# Patient Record
Sex: Female | Born: 1980 | Race: Black or African American | Hispanic: No | Marital: Married | Smoking: Never smoker
Health system: Southern US, Community
[De-identification: ages and names within clinical notes are randomized; demographics above are authoritative.]

## PROBLEM LIST (undated history)

## (undated) ENCOUNTER — Inpatient Hospital Stay (HOSPITAL_COMMUNITY): Payer: Self-pay

## (undated) DIAGNOSIS — E039 Hypothyroidism, unspecified: Secondary | ICD-10-CM

## (undated) DIAGNOSIS — E0590 Thyrotoxicosis, unspecified without thyrotoxic crisis or storm: Secondary | ICD-10-CM

## (undated) DIAGNOSIS — IMO0002 Reserved for concepts with insufficient information to code with codable children: Secondary | ICD-10-CM

---
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
## 2002-02-16 ENCOUNTER — Other Ambulatory Visit: Admission: RE | Admit: 2002-02-16 | Discharge: 2002-02-16 | Payer: Self-pay | Admitting: Obstetrics and Gynecology

## 2003-01-16 ENCOUNTER — Ambulatory Visit (HOSPITAL_COMMUNITY): Admission: RE | Admit: 2003-01-16 | Discharge: 2003-01-16 | Payer: Self-pay | Admitting: Internal Medicine

## 2003-03-03 HISTORY — PX: LEEP: SHX91

## 2004-05-05 ENCOUNTER — Other Ambulatory Visit: Admission: RE | Admit: 2004-05-05 | Discharge: 2004-05-05 | Payer: Self-pay | Admitting: Internal Medicine

## 2004-12-31 ENCOUNTER — Other Ambulatory Visit: Admission: RE | Admit: 2004-12-31 | Discharge: 2004-12-31 | Payer: Self-pay | Admitting: Obstetrics and Gynecology

## 2011-01-20 ENCOUNTER — Ambulatory Visit (HOSPITAL_COMMUNITY)
Admission: AD | Admit: 2011-01-20 | Discharge: 2011-01-20 | Disposition: A | Discharge status: Home / Self Care | Payer: BC Managed Care – PPO | Source: Ambulatory Visit | Attending: Internal Medicine | Admitting: Internal Medicine

## 2011-01-20 ENCOUNTER — Inpatient Hospital Stay (HOSPITAL_COMMUNITY): Payer: BC Managed Care – PPO

## 2011-01-20 DIAGNOSIS — R1032 Left lower quadrant pain: Secondary | ICD-10-CM | POA: Insufficient documentation

## 2011-01-20 DIAGNOSIS — K5900 Constipation, unspecified: Secondary | ICD-10-CM | POA: Insufficient documentation

## 2011-01-20 DIAGNOSIS — O99891 Other specified diseases and conditions complicating pregnancy: Secondary | ICD-10-CM | POA: Insufficient documentation

## 2011-06-16 NOTE — H&P (Signed)
NAMEBREALYN, BARIL NO.:  [id number]  MEDICAL RECORD NO.:  [id number]  LOCATION:  [facility]                         FACILITY:  [facility]  PHYSICIAN:  Juluis Mire, M.D.   DATE OF BIRTH:  1980/08/24  DATE OF ADMISSION:  06/18/2011 DATE OF DISCHARGE:                             HISTORY & PHYSICAL   Date of her surgery is 07/01/2011 at [facility].  HISTORY OF PRESENT ILLNESS:  The patient is a 31 year old gravida 1, para 0 female who presents for D and E for nonviable first trimester pregnancy.  The patient was seen for pregnancy confirmation on April 8th ultrasound, at that time revealed gestational sac consistent with approximately 7 weeks.  No fetal pole or yolk sac was seen.  She was having no bleeding. We followed her up a week later and the sac had enlarged, but still no fetal pole or yolk sac consistent with an anembryonic pregnancy.  She now presents for dilatation and evacuation.  ALLERGIES:  In terms of allergies, she is allergic to [facility].  MEDICATIONS:  None.  PAST MEDICAL HISTORY:  Usual childhood diseases.  No significant sequelae.  SURGICAL HISTORY:  None.  FAMILY HISTORY:  Noncontributory.  SOCIAL HISTORY:  Reveals no tobacco or alcohol use.  REVIEW OF SYSTEMS:  Noncontributory.  PHYSICAL EXAMINATION:  VITAL SIGNS:  The patient is afebrile with stable vital signs. HEENT:  The patient is normocephalic.  Pupils are equal, round, reactive to light and accommodation.  Extraocular movements are intact.  Sclerae and conjunctivae are clear.  Oropharynx clear. BREASTS:  Not examined. LUNGS:  Clear. CARDIOVASCULAR SYSTEM:  Regular rhythm and rate without murmurs or gallops. ABDOMEN:  Abdominal exam is benign.  No mass, organomegaly, or tenderness. PELVIC:  Normal external genitalia.  Vaginal mucosa is clear.  Cervix unremarkable.  Uterus approximately 8-9 weeks in size.  Adnexa unremarkable. EXTREMITIES:  Trace edema. NEUROLOGIC:   Grossly within normal limits.  IMPRESSION:  Nonviable first trimester pregnancy.  PLAN:  The patient undergo dilatation and evacuation.  The risk of procedure have been discussed including the risk of infection.  The risk of hemorrhage that could require transfusion with the risk of AIDS or hepatitis.  Excessive bleeding could require hysterectomy.  There is a risk of perforation leading to injury to adjacent organs requiring further exploratory surgery.  Risk of deep venous thrombosis and pulmonary embolus.  The patient expressed understand of indications and risks.  Blood type will be determined.     Juluis Mire, M.D.     JSM/MEDQ  D:  06/16/2011  T:  06/16/2011  Job:  [id number]

## 2011-06-16 NOTE — H&P (Signed)
  Patient name  Kelsey Dodson, Kelsey Dodson DICTATION#  [id number] CSN# [id number]  Juluis Mire, MD 06/16/2011 1:22 PM

## 2011-06-17 ENCOUNTER — Encounter (HOSPITAL_COMMUNITY): Payer: Self-pay | Admitting: Pharmacist

## 2011-06-17 MED ORDER — CEFAZOLIN SODIUM 1-5 GM-% IV SOLN
1.0000 g | INTRAVENOUS | Status: AC
  Administered 2011-06-18: 1 g via INTRAVENOUS

## 2011-06-18 ENCOUNTER — Encounter (HOSPITAL_COMMUNITY)
Admission: RE | Disposition: A | Discharge status: Home / Self Care | Payer: Self-pay | Source: Ambulatory Visit | Attending: Obstetrics and Gynecology

## 2011-06-18 ENCOUNTER — Ambulatory Visit (HOSPITAL_COMMUNITY)
Admission: RE | Admit: 2011-06-18 | Discharge: 2011-06-18 | Disposition: A | Discharge status: Home / Self Care | Payer: BC Managed Care – PPO | Source: Ambulatory Visit | Attending: Obstetrics and Gynecology | Admitting: Obstetrics and Gynecology

## 2011-06-18 ENCOUNTER — Ambulatory Visit (HOSPITAL_COMMUNITY): Payer: BC Managed Care – PPO | Admitting: Anesthesiology

## 2011-06-18 ENCOUNTER — Encounter (HOSPITAL_COMMUNITY): Payer: Self-pay | Admitting: *Deleted

## 2011-06-18 ENCOUNTER — Encounter (HOSPITAL_COMMUNITY): Payer: Self-pay | Admitting: Anesthesiology

## 2011-06-18 DIAGNOSIS — O021 Missed abortion: Secondary | ICD-10-CM | POA: Insufficient documentation

## 2011-06-18 DIAGNOSIS — Z332 Encounter for elective termination of pregnancy: Secondary | ICD-10-CM

## 2011-06-18 HISTORY — PX: DILATION AND EVACUATION: SHX1459

## 2011-06-18 LAB — CBC
HCT: 39.7 % (ref 36.0–46.0)
Hemoglobin: 13.4 g/dL (ref 12.0–15.0)
MCH: 30.5 pg (ref 26.0–34.0)
MCHC: 33.8 g/dL (ref 30.0–36.0)
MCV: 90.4 fL (ref 78.0–100.0)
Platelets: 332 10*3/uL (ref 150–400)
RBC: 4.39 MIL/uL (ref 3.87–5.11)
RDW: 12.4 % (ref 11.5–15.5)
WBC: 11.2 10*3/uL — ABNORMAL HIGH (ref 4.0–10.5)

## 2011-06-18 LAB — ABO/RH: ABO/RH(D): A POS

## 2011-06-18 SURGERY — DILATION AND EVACUATION, UTERUS
Anesthesia: Monitor Anesthesia Care | Wound class: Clean Contaminated

## 2011-06-18 MED ORDER — LACTATED RINGERS IV SOLN
INTRAVENOUS | Status: DC
  Administered 2011-06-18: 12:00:00 via INTRAVENOUS

## 2011-06-18 MED ORDER — KETOROLAC TROMETHAMINE 30 MG/ML IJ SOLN
INTRAMUSCULAR | Status: AC
  Filled 2011-06-18: qty 1

## 2011-06-18 MED ORDER — FENTANYL CITRATE 0.05 MG/ML IJ SOLN
INTRAMUSCULAR | Status: DC | PRN
  Administered 2011-06-18: 100 ug via INTRAVENOUS

## 2011-06-18 MED ORDER — PROPOFOL 10 MG/ML IV EMUL
INTRAVENOUS | Status: DC | PRN
  Administered 2011-06-18 (×3): 20 mg via INTRAVENOUS
  Administered 2011-06-18: 10 mg via INTRAVENOUS

## 2011-06-18 MED ORDER — FENTANYL CITRATE 0.05 MG/ML IJ SOLN: 25.0000 ug | INTRAMUSCULAR | Status: DC | PRN

## 2011-06-18 MED ORDER — CEFAZOLIN SODIUM 1-5 GM-% IV SOLN
INTRAVENOUS | Status: AC
  Filled 2011-06-18: qty 50

## 2011-06-18 MED ORDER — CHLOROPROCAINE HCL 1 % IJ SOLN
INTRAMUSCULAR | Status: AC
  Filled 2011-06-18: qty 30

## 2011-06-18 MED ORDER — ONDANSETRON HCL 4 MG/2ML IJ SOLN
INTRAMUSCULAR | Status: DC | PRN
  Administered 2011-06-18: 4 mg via INTRAVENOUS

## 2011-06-18 MED ORDER — MIDAZOLAM HCL 5 MG/5ML IJ SOLN
INTRAMUSCULAR | Status: DC | PRN
  Administered 2011-06-18: 2 mg via INTRAVENOUS

## 2011-06-18 MED ORDER — MIDAZOLAM HCL 2 MG/2ML IJ SOLN
INTRAMUSCULAR | Status: AC
  Filled 2011-06-18: qty 2

## 2011-06-18 MED ORDER — CHLOROPROCAINE HCL 1 % IJ SOLN
INTRAMUSCULAR | Status: DC | PRN
  Administered 2011-06-18: 20 mL

## 2011-06-18 MED ORDER — PROPOFOL 10 MG/ML IV EMUL
INTRAVENOUS | Status: AC
  Filled 2011-06-18: qty 20

## 2011-06-18 MED ORDER — ONDANSETRON HCL 4 MG/2ML IJ SOLN
INTRAMUSCULAR | Status: AC
  Filled 2011-06-18: qty 2

## 2011-06-18 MED ORDER — KETOROLAC TROMETHAMINE 30 MG/ML IJ SOLN: 15.0000 mg | Freq: Once | INTRAMUSCULAR | Status: DC | PRN

## 2011-06-18 MED ORDER — DEXAMETHASONE SODIUM PHOSPHATE 10 MG/ML IJ SOLN
INTRAMUSCULAR | Status: DC | PRN
  Administered 2011-06-18: 10 mg via INTRAVENOUS

## 2011-06-18 MED ORDER — DEXAMETHASONE SODIUM PHOSPHATE 10 MG/ML IJ SOLN
INTRAMUSCULAR | Status: AC
  Filled 2011-06-18: qty 1

## 2011-06-18 MED ORDER — MEPERIDINE HCL 25 MG/ML IJ SOLN: 6.2500 mg | INTRAMUSCULAR | Status: DC | PRN

## 2011-06-18 MED ORDER — ONDANSETRON HCL 4 MG/2ML IJ SOLN: 4.0000 mg | Freq: Once | INTRAMUSCULAR | Status: DC | PRN

## 2011-06-18 MED ORDER — FENTANYL CITRATE 0.05 MG/ML IJ SOLN
INTRAMUSCULAR | Status: AC
  Filled 2011-06-18: qty 2

## 2011-06-18 MED ORDER — OXYCODONE-ACETAMINOPHEN 5-500 MG PO CAPS: 1.0000 | ORAL_CAPSULE | ORAL | Status: AC | PRN

## 2011-06-18 MED ORDER — LIDOCAINE HCL (CARDIAC) 20 MG/ML IV SOLN
INTRAVENOUS | Status: DC | PRN
  Administered 2011-06-18: 60 mg via INTRAVENOUS

## 2011-06-18 MED ORDER — LIDOCAINE HCL (CARDIAC) 20 MG/ML IV SOLN
INTRAVENOUS | Status: AC
  Filled 2011-06-18: qty 5

## 2011-06-18 SURGICAL SUPPLY — 19 items
CATH ROBINSON RED A/P 16FR (CATHETERS) ×2 IMPLANT
CLOTH BEACON ORANGE TIMEOUT ST (SAFETY) ×2 IMPLANT
DECANTER SPIKE VIAL GLASS SM (MISCELLANEOUS) ×2 IMPLANT
GLOVE BIO SURGEON STRL SZ7 (GLOVE) ×4 IMPLANT
GLOVE NEODERM STER SZ 7 (GLOVE) ×2 IMPLANT
GOWN PREVENTION PLUS LG XLONG (DISPOSABLE) ×3 IMPLANT
KIT BERKELEY 1ST TRIMESTER 3/8 (MISCELLANEOUS) ×2 IMPLANT
NDL SPNL 20GX3.5 QUINCKE YW (NEEDLE) ×1 IMPLANT
NEEDLE SPNL 20GX3.5 QUINCKE YW (NEEDLE) ×2 IMPLANT
NS IRRIG 1000ML POUR BTL (IV SOLUTION) ×2 IMPLANT
PACK VAGINAL MINOR WOMEN LF (CUSTOM PROCEDURE TRAY) ×2 IMPLANT
PAD PREP 24X48 CUFFED NSTRL (MISCELLANEOUS) ×2 IMPLANT
SET BERKELEY SUCTION TUBING (SUCTIONS) ×2 IMPLANT
SYR CONTROL 10ML LL (SYRINGE) ×2 IMPLANT
TOWEL OR 17X24 6PK STRL BLUE (TOWEL DISPOSABLE) ×4 IMPLANT
VACURETTE 10 RIGID CVD (CANNULA) IMPLANT
VACURETTE 7MM CVD STRL WRAP (CANNULA) IMPLANT
VACURETTE 8 RIGID CVD (CANNULA) ×1 IMPLANT
VACURETTE 9 RIGID CVD (CANNULA) IMPLANT

## 2011-06-18 NOTE — H&P (Signed)
  histoey and physicalo exam unchanged

## 2011-06-18 NOTE — Op Note (Signed)
Patient name  Kelsey Dodson, Kelsey Dodson DICTATION#  [id number] CSN# [id number]  Juluis Mire, MD 06/18/2011 1:22 PM

## 2011-06-18 NOTE — Discharge Instructions (Signed)

## 2011-06-18 NOTE — Brief Op Note (Signed)
06/18/2011  1:21 PM  PATIENT:  Kelsey Dodson  31 y.o. female  PRE-OPERATIVE DIAGNOSIS:  NONVIABLE FIRST TRIMESTER PREGNANCY  POST-OPERATIVE DIAGNOSIS:  NONVIABLE FIRST TRIMESTER PREGNANCY  PROCEDURE:  Procedure(s) (LRB): DILATATION AND EVACUATION (N/A)  SURGEON:  Surgeon(s) and Role:    * Juluis Mire, MD - Primary  PHYSICIAN ASSISTANT:   ASSISTANTS: none   ANESTHESIA:   local and IV sedation  EBL:  Total I/O In: -  Out: 50 [Blood:50]  BLOOD ADMINISTERED:none  DRAINS: none   LOCAL MEDICATIONS USED:  OTHER nesicaine 20 cc  SPECIMEN:  Source of Specimen:  poc  DISPOSITION OF SPECIMEN:  PATHOLOGY  COUNTS:  YES  TOURNIQUET:  * No tourniquets in log *  DICTATION: .Other Dictation: Dictation Number [id number]  PLAN OF CARE: Discharge to home after PACU  PATIENT DISPOSITION:  PACU - hemodynamically stable.   Delay start of Pharmacological VTE agent (>24hrs) due to surgical blood loss or risk of bleeding: no

## 2011-06-18 NOTE — Transfer of Care (Signed)
Immediate Anesthesia Transfer of Care Note  Patient: Kelsey Dodson  Procedure(s) Performed: Procedure(s) (LRB): DILATATION AND EVACUATION (N/A)  Patient Location: PACU  Anesthesia Type: MAC  Level of Consciousness: sedated  Airway & Oxygen Therapy: Patient Spontanous Breathing  Post-op Assessment: Report given to PACU RN  Post vital signs: Reviewed and stable  Complications: No apparent anesthesia complications

## 2011-06-18 NOTE — Anesthesia Postprocedure Evaluation (Signed)
Anesthesia Post Note  Patient: Kelsey Dodson  Procedure(s) Performed: Procedure(s) (LRB): DILATATION AND EVACUATION (N/A)  Anesthesia type: MAC  Patient location: PACU  Post pain: Pain level controlled  Post assessment: Post-op Vital signs reviewed  Last Vitals:  Filed Vitals:   06/18/11 1345  BP: 124/64  Pulse: 58  Temp: 37.1 C  Resp: 16    Post vital signs: Reviewed  Level of consciousness: sedated  Complications: No apparent anesthesia complications

## 2011-06-18 NOTE — Anesthesia Preprocedure Evaluation (Signed)
Anesthesia Evaluation  Patient identified by MRN, date of birth, ID band  Reviewed: Allergy & Precautions, H&P , NPO status , Patient's Chart, lab work & pertinent test results  Airway Mallampati: I TM Distance: >3 FB Neck ROM: full    Dental  (+) Teeth Intact   Pulmonary neg pulmonary ROS,    Pulmonary exam normal       Cardiovascular negative cardio ROS      Neuro/Psych negative neurological ROS  negative psych ROS   GI/Hepatic negative GI ROS, Neg liver ROS,   Endo/Other  negative endocrine ROS  Renal/GU negative Renal ROS  negative genitourinary   Musculoskeletal negative musculoskeletal ROS (+)   Abdominal Normal abdominal exam  (+)   Peds negative pediatric ROS (+)  Hematology negative hematology ROS (+)   Anesthesia Other Findings   Reproductive/Obstetrics (+) Pregnancy                           Anesthesia Physical Anesthesia Plan  ASA: II  Anesthesia Plan: MAC   Post-op Pain Management:    Induction: Intravenous  Airway Management Planned:   Additional Equipment:   Intra-op Plan:   Post-operative Plan:   Informed Consent: I have reviewed the patients History and Physical, chart, labs and discussed the procedure including the risks, benefits and alternatives for the proposed anesthesia with the patient or authorized representative who has indicated his/her understanding and acceptance.     Plan Discussed with: CRNA and Surgeon  Anesthesia Plan Comments:         Anesthesia Quick Evaluation

## 2011-06-19 NOTE — Op Note (Signed)
[name], Kelsey Dodson               ACCOUNT NO.:  [id number]  MEDICAL RECORD NO.:  [id number]  LOCATION:  [facility]                          FACILITY:  [facility]  PHYSICIAN:  Juluis Mire, M.D.   DATE OF BIRTH:  [birth year]  DATE OF PROCEDURE:  06/18/2011 DATE OF DISCHARGE:  06/18/2011                              OPERATIVE REPORT   PREOPERATIVE DIAGNOSIS:  Nonviable first trimester pregnancy.  POSTOPERATIVE DIAGNOSIS:  Nonviable first trimester pregnancy.  PROCEDURE:  Paracervical block.  Dilation and evacuation.  SURGEON:  Juluis Mire, MD  ANESTHESIA:  Sedation with paracervical block.  ESTIMATED BLOOD LOSS:  200-250 mL.  PACKS:  None.  DRAINS:  None.  INTRAOPERATIVE BLOOD PLACED:  None.  COMPLICATIONS:  None.  INDICATION:  Dictated in history and physical.  Procedure as follows; the patient was taken to the OR, and placed in supine position.  After satisfactory level of sedation, she was placed in the dorsal lithotomy position using the Allen stirrups.  The patient draped as a sterile field.  Spec was placed in vaginal vault.  Cervix was cleansed with Betadine.  Paracervical block was instituted using 2% Nesacaine.  Cervix was grasped with single-tooth tenaculum.  Uterus was retroflexed and sounded to approximately 11 cm.  Cervix was serially dilated to size 27 Pratt dilator.  Size 8 curved suction curette was introduced to intrauterine cavity, was evacuated using the suction curette.  We continued until no additional tissue was obtained.  We then did sharp curetting.  All quadrants were cleared by the gritty field and repeat suction curetting revealed no additional tissue.  The uterus was contracting down well and bleeding was minimal.  At this point in time, the single-tooth tenaculum speculum then removed.  The patient taken out of dorsal lithotomy position.  Once alert, transferred to recovery room in good condition.  Sponge, instrument, and needle count was correct by  circulating nurse.     Juluis Mire, M.D.     JSM/MEDQ  D:  06/18/2011  T:  06/25/11  Job:  [id number]

## 2011-06-22 ENCOUNTER — Encounter (HOSPITAL_COMMUNITY): Payer: Self-pay | Admitting: Obstetrics and Gynecology

## 2011-07-01 DEATH — deceased

## 2012-04-19 LAB — OB RESULTS CONSOLE RPR: RPR: NONREACTIVE

## 2012-04-19 LAB — OB RESULTS CONSOLE HEPATITIS B SURFACE ANTIGEN: Hepatitis B Surface Ag: NEGATIVE

## 2012-04-19 LAB — OB RESULTS CONSOLE RUBELLA ANTIBODY, IGM: Rubella: IMMUNE

## 2012-04-19 LAB — OB RESULTS CONSOLE ABO/RH: RH Type: POSITIVE

## 2012-04-19 LAB — OB RESULTS CONSOLE GC/CHLAMYDIA
Chlamydia: NEGATIVE
Gonorrhea: NEGATIVE

## 2012-04-19 LAB — OB RESULTS CONSOLE HIV ANTIBODY (ROUTINE TESTING): HIV: NONREACTIVE

## 2012-04-19 LAB — OB RESULTS CONSOLE ANTIBODY SCREEN: Antibody Screen: NEGATIVE

## 2012-07-13 ENCOUNTER — Encounter (HOSPITAL_COMMUNITY): Payer: Self-pay | Admitting: *Deleted

## 2012-07-13 ENCOUNTER — Inpatient Hospital Stay (HOSPITAL_COMMUNITY)
Admission: AD | Admit: 2012-07-13 | Discharge: 2012-07-13 | Disposition: A | Discharge status: Home / Self Care | Payer: BC Managed Care – PPO | Source: Ambulatory Visit | Attending: Obstetrics and Gynecology | Admitting: Obstetrics and Gynecology

## 2012-07-13 ENCOUNTER — Inpatient Hospital Stay (HOSPITAL_COMMUNITY): Payer: BC Managed Care – PPO

## 2012-07-13 DIAGNOSIS — O26899 Other specified pregnancy related conditions, unspecified trimester: Secondary | ICD-10-CM

## 2012-07-13 DIAGNOSIS — O99891 Other specified diseases and conditions complicating pregnancy: Secondary | ICD-10-CM | POA: Insufficient documentation

## 2012-07-13 DIAGNOSIS — R109 Unspecified abdominal pain: Secondary | ICD-10-CM

## 2012-07-13 HISTORY — DX: Reserved for concepts with insufficient information to code with codable children: IMO0002

## 2012-07-13 LAB — URINALYSIS, ROUTINE W REFLEX MICROSCOPIC
Protein, ur: NEGATIVE mg/dL
Urobilinogen, UA: 0.2 mg/dL (ref 0.0–1.0)

## 2012-07-13 LAB — URINE MICROSCOPIC-ADD ON

## 2012-07-13 LAB — WET PREP, GENITAL
Clue Cells Wet Prep HPF POC: NONE SEEN
Trich, Wet Prep: NONE SEEN

## 2012-07-13 NOTE — MAU Note (Signed)
Pt reports pain in her "buttocks" yesterday, was seen in office for b/p recheck (b/p was nml) and cervical length per u/s was 2.8. Pain this am is in pelvic area and is coming about every 3 minutes.

## 2012-07-13 NOTE — MAU Provider Note (Signed)
History     CSN: [id number]  Arrival date and time: 07/13/12 4098   None     Chief Complaint  Patient presents with  . Abdominal Pain   HPI  Pt is a G2P0010 here at 20.2 wks  IUP with report of intermittent abdominal pain (lower pelvis) that started around 0300 this morning.  Pain is described as sharp.  Cervical length 2.8 from last ultrasound.  Follow-up appt scheduled for this Friday (repeat ultrasound).  No report of vaginal bleeding or leaking of fluid.    Past Medical History  Diagnosis Date  . Abnormal Pap smear     Past Surgical History  Procedure Laterality Date  . Leep  2005  . Dilation and evacuation  06/18/2011    Procedure: DILATATION AND EVACUATION;  Surgeon: Juluis Mire, MD;  Location: [facility];  Service: Gynecology;  Laterality: N/A;    History reviewed. No pertinent family history.  History  Substance Use Topics  . Smoking status: Never Smoker   . Smokeless tobacco: Never Used  . Alcohol Use: No    Allergies:  Allergies  Allergen Reactions  . Levofloxacin Hives  . Macrobid (Nitrofurantoin) Hives    Prescriptions prior to admission  Medication Sig Dispense Refill  . acetaminophen (TYLENOL) 325 MG tablet Take 650 mg by mouth once.      . cetirizine (ZYRTEC) 10 MG tablet Take 10 mg by mouth daily as needed. For allergies      . Prenatal Vit-Fe Fumarate-FA (PRENATAL MULTIVITAMIN) TABS Take 1 tablet by mouth daily.      . sodium chloride (OCEAN) 0.65 % nasal spray Place 1 spray into the nose as needed. For allergies        Review of Systems  Gastrointestinal: Positive for abdominal pain (intermittent pain).  All other systems reviewed and are negative.   Physical Exam   Blood pressure 122/65, pulse 86, temperature 98.3 F (36.8 C), temperature source Oral, resp. rate 20, height 5\' 7"  (1.702 m), weight 83.462 kg (184 lb), SpO2 100.00%.  Physical Exam  Constitutional: She is oriented to person, place, and time. She appears well-developed and  well-nourished. No distress.  HENT:  Head: Normocephalic.  Neck: Normal range of motion. Neck supple.  Cardiovascular: Normal rate, regular rhythm and normal heart sounds.   Respiratory: Effort normal and breath sounds normal.  GI: Soft. There is tenderness (lower pelvis).  Genitourinary: No bleeding around the vagina. Vaginal discharge (large amount of white, creamy discharge) found.  Neurological: She is alert and oriented to person, place, and time.  Skin: Skin is warm and dry.   Cervix - closed, palpates short, uterus feels lower in pelvis  MAU Course  Procedures  Consult with Dr. Arelia Sneddon > cervical length  Results for orders placed during the hospital encounter of 07/13/12 (from the past 24 hour(s))  URINALYSIS, ROUTINE W REFLEX MICROSCOPIC     Status: Abnormal   Collection Time    07/13/12  7:02 AM      Result Value Range   Color, Urine YELLOW  YELLOW   APPearance CLEAR  CLEAR   Specific Gravity, Urine 1.015  1.005 - 1.030   pH 7.0  5.0 - 8.0   Glucose, UA NEGATIVE  NEGATIVE mg/dL   Hgb urine dipstick SMALL (*) NEGATIVE   Bilirubin Urine NEGATIVE  NEGATIVE   Ketones, ur NEGATIVE  NEGATIVE mg/dL   Protein, ur NEGATIVE  NEGATIVE mg/dL   Urobilinogen, UA 0.2  0.0 - 1.0 mg/dL   Nitrite  NEGATIVE  NEGATIVE   Leukocytes, UA NEGATIVE  NEGATIVE  URINE MICROSCOPIC-ADD ON     Status: None   Collection Time    07/13/12  7:02 AM      Result Value Range   Squamous Epithelial / LPF RARE  RARE   WBC, UA 0-2  <3 WBC/hpf   Bacteria, UA RARE  RARE  WET PREP, GENITAL     Status: Abnormal   Collection Time    07/13/12  8:25 AM      Result Value Range   Yeast Wet Prep HPF POC NONE SEEN  NONE SEEN   Trich, Wet Prep NONE SEEN  NONE SEEN   Clue Cells Wet Prep HPF POC NONE SEEN  NONE SEEN   WBC, Wet Prep HPF POC FEW (*) NONE SEEN   Ultrasound:   Cervix closed; 2.7-3.0  Assessment and Plan  Abdominal Pain in Pregnancy - Normal Exam  Plan: DC to home Provide Reassurance Keep  scheduled appt on Friday  [facility] 07/13/2012, 8:10 AM

## 2012-11-03 LAB — OB RESULTS CONSOLE GBS: GBS: NEGATIVE

## 2012-11-23 ENCOUNTER — Inpatient Hospital Stay (HOSPITAL_COMMUNITY): Payer: BC Managed Care – PPO | Admitting: Anesthesiology

## 2012-11-23 ENCOUNTER — Encounter (HOSPITAL_COMMUNITY): Payer: Self-pay | Admitting: *Deleted

## 2012-11-23 ENCOUNTER — Inpatient Hospital Stay (HOSPITAL_COMMUNITY)
Admission: AD | Admit: 2012-11-23 | Discharge: 2012-11-25 | DRG: 373 | Disposition: A | Discharge status: Home / Self Care | Payer: BC Managed Care – PPO | Source: Ambulatory Visit | Attending: Obstetrics and Gynecology | Admitting: Obstetrics and Gynecology

## 2012-11-23 ENCOUNTER — Encounter (HOSPITAL_COMMUNITY): Payer: Self-pay | Admitting: Anesthesiology

## 2012-11-23 LAB — CBC
HCT: 34.1 % — ABNORMAL LOW (ref 36.0–46.0)
Hemoglobin: 11.7 g/dL — ABNORMAL LOW (ref 12.0–15.0)
MCH: 29.5 pg (ref 26.0–34.0)
MCV: 85.9 fL (ref 78.0–100.0)
RBC: 3.97 MIL/uL (ref 3.87–5.11)
WBC: 12.6 10*3/uL — ABNORMAL HIGH (ref 4.0–10.5)

## 2012-11-23 MED ORDER — OXYCODONE-ACETAMINOPHEN 5-325 MG PO TABS: 1.0000 | ORAL_TABLET | ORAL | Status: DC | PRN

## 2012-11-23 MED ORDER — INFLUENZA VAC SPLIT QUAD 0.5 ML IM SUSP
0.5000 mL | INTRAMUSCULAR | Status: AC
  Administered 2012-11-24: 0.5 mL via INTRAMUSCULAR

## 2012-11-23 MED ORDER — LIDOCAINE HCL (PF) 1 % IJ SOLN
INTRAMUSCULAR | Status: DC | PRN
  Administered 2012-11-23 (×2): 4 mL

## 2012-11-23 MED ORDER — BUTORPHANOL TARTRATE 1 MG/ML IJ SOLN
1.0000 mg | Freq: Once | INTRAMUSCULAR | Status: AC
  Administered 2012-11-23: 1 mg via INTRAVENOUS
  Filled 2012-11-23: qty 1

## 2012-11-23 MED ORDER — LACTATED RINGERS IV SOLN
INTRAVENOUS | Status: DC
  Administered 2012-11-23 (×3): via INTRAVENOUS

## 2012-11-23 MED ORDER — ONDANSETRON HCL 4 MG PO TABS: 4.0000 mg | ORAL_TABLET | ORAL | Status: DC | PRN

## 2012-11-23 MED ORDER — LANOLIN HYDROUS EX OINT: TOPICAL_OINTMENT | CUTANEOUS | Status: DC | PRN

## 2012-11-23 MED ORDER — WITCH HAZEL-GLYCERIN EX PADS: 1.0000 "application " | MEDICATED_PAD | CUTANEOUS | Status: DC | PRN

## 2012-11-23 MED ORDER — FAMOTIDINE 20 MG PO TABS
20.0000 mg | ORAL_TABLET | Freq: Two times a day (BID) | ORAL | Status: DC
  Administered 2012-11-23 – 2012-11-25 (×2): 20 mg via ORAL
  Filled 2012-11-23 (×2): qty 1

## 2012-11-23 MED ORDER — IBUPROFEN 600 MG PO TABS: 600.0000 mg | ORAL_TABLET | Freq: Four times a day (QID) | ORAL | Status: DC | PRN

## 2012-11-23 MED ORDER — MEASLES, MUMPS & RUBELLA VAC ~~LOC~~ INJ
0.5000 mL | INJECTION | Freq: Once | SUBCUTANEOUS | Status: DC
  Filled 2012-11-23: qty 0.5

## 2012-11-23 MED ORDER — LIDOCAINE HCL (PF) 1 % IJ SOLN
30.0000 mL | INTRAMUSCULAR | Status: DC | PRN
  Filled 2012-11-23: qty 30

## 2012-11-23 MED ORDER — DIPHENHYDRAMINE HCL 50 MG/ML IJ SOLN: 12.5000 mg | INTRAMUSCULAR | Status: DC | PRN

## 2012-11-23 MED ORDER — PRENATAL MULTIVITAMIN CH
1.0000 | ORAL_TABLET | Freq: Every day | ORAL | Status: DC
  Administered 2012-11-24: 1 via ORAL
  Filled 2012-11-23: qty 1

## 2012-11-23 MED ORDER — DIPHENHYDRAMINE HCL 25 MG PO CAPS: 25.0000 mg | ORAL_CAPSULE | Freq: Four times a day (QID) | ORAL | Status: DC | PRN

## 2012-11-23 MED ORDER — LACTATED RINGERS IV SOLN: 500.0000 mL | INTRAVENOUS | Status: DC | PRN

## 2012-11-23 MED ORDER — EPHEDRINE 5 MG/ML INJ
10.0000 mg | INTRAVENOUS | Status: DC | PRN
  Administered 2012-11-23: 10 mg via INTRAVENOUS
  Filled 2012-11-23: qty 2

## 2012-11-23 MED ORDER — CITRIC ACID-SODIUM CITRATE 334-500 MG/5ML PO SOLN: 30.0000 mL | ORAL | Status: DC | PRN

## 2012-11-23 MED ORDER — DIBUCAINE 1 % RE OINT: 1.0000 "application " | TOPICAL_OINTMENT | RECTAL | Status: DC | PRN

## 2012-11-23 MED ORDER — SENNOSIDES-DOCUSATE SODIUM 8.6-50 MG PO TABS
2.0000 | ORAL_TABLET | ORAL | Status: DC
  Administered 2012-11-24 – 2012-11-25 (×2): 2 via ORAL

## 2012-11-23 MED ORDER — IBUPROFEN 600 MG PO TABS
600.0000 mg | ORAL_TABLET | Freq: Four times a day (QID) | ORAL | Status: DC
  Administered 2012-11-24 – 2012-11-25 (×6): 600 mg via ORAL
  Filled 2012-11-23 (×6): qty 1

## 2012-11-23 MED ORDER — PHENYLEPHRINE 40 MCG/ML (10ML) SYRINGE FOR IV PUSH (FOR BLOOD PRESSURE SUPPORT)
80.0000 ug | PREFILLED_SYRINGE | INTRAVENOUS | Status: DC | PRN
  Filled 2012-11-23: qty 2

## 2012-11-23 MED ORDER — FENTANYL 2.5 MCG/ML BUPIVACAINE 1/10 % EPIDURAL INFUSION (WH - ANES)
INTRAMUSCULAR | Status: DC | PRN
  Administered 2012-11-23: 14 mL/h via EPIDURAL

## 2012-11-23 MED ORDER — ACETAMINOPHEN 325 MG PO TABS: 650.0000 mg | ORAL_TABLET | ORAL | Status: DC | PRN

## 2012-11-23 MED ORDER — ONDANSETRON HCL 4 MG/2ML IJ SOLN: 4.0000 mg | Freq: Four times a day (QID) | INTRAMUSCULAR | Status: DC | PRN

## 2012-11-23 MED ORDER — BENZOCAINE-MENTHOL 20-0.5 % EX AERO
1.0000 "application " | INHALATION_SPRAY | CUTANEOUS | Status: DC | PRN
  Administered 2012-11-23: 1 via TOPICAL
  Filled 2012-11-23: qty 56

## 2012-11-23 MED ORDER — OXYTOCIN BOLUS FROM INFUSION: 500.0000 mL | INTRAVENOUS | Status: DC

## 2012-11-23 MED ORDER — SIMETHICONE 80 MG PO CHEW: 80.0000 mg | CHEWABLE_TABLET | ORAL | Status: DC | PRN

## 2012-11-23 MED ORDER — FENTANYL 2.5 MCG/ML BUPIVACAINE 1/10 % EPIDURAL INFUSION (WH - ANES)
14.0000 mL/h | INTRAMUSCULAR | Status: DC | PRN
  Filled 2012-11-23: qty 125

## 2012-11-23 MED ORDER — PHENYLEPHRINE 40 MCG/ML (10ML) SYRINGE FOR IV PUSH (FOR BLOOD PRESSURE SUPPORT)
80.0000 ug | PREFILLED_SYRINGE | INTRAVENOUS | Status: DC | PRN
  Filled 2012-11-23: qty 2
  Filled 2012-11-23: qty 5

## 2012-11-23 MED ORDER — MEDROXYPROGESTERONE ACETATE 150 MG/ML IM SUSP: 150.0000 mg | INTRAMUSCULAR | Status: DC | PRN

## 2012-11-23 MED ORDER — EPHEDRINE 5 MG/ML INJ
10.0000 mg | INTRAVENOUS | Status: DC | PRN
  Filled 2012-11-23: qty 2
  Filled 2012-11-23: qty 4

## 2012-11-23 MED ORDER — LACTATED RINGERS IV SOLN
500.0000 mL | Freq: Once | INTRAVENOUS | Status: AC
  Administered 2012-11-23: 1000 mL via INTRAVENOUS

## 2012-11-23 MED ORDER — TETANUS-DIPHTH-ACELL PERTUSSIS 5-2.5-18.5 LF-MCG/0.5 IM SUSP: 0.5000 mL | Freq: Once | INTRAMUSCULAR | Status: DC

## 2012-11-23 MED ORDER — ONDANSETRON HCL 4 MG/2ML IJ SOLN: 4.0000 mg | INTRAMUSCULAR | Status: DC | PRN

## 2012-11-23 MED ORDER — OXYTOCIN 40 UNITS IN LACTATED RINGERS INFUSION - SIMPLE MED
62.5000 mL/h | INTRAVENOUS | Status: DC
  Filled 2012-11-23: qty 1000

## 2012-11-23 MED ORDER — PROMETHAZINE HCL 25 MG/ML IJ SOLN
12.5000 mg | Freq: Once | INTRAMUSCULAR | Status: AC
  Administered 2012-11-23: 12.5 mg via INTRAVENOUS
  Filled 2012-11-23: qty 1

## 2012-11-23 NOTE — Anesthesia Procedure Notes (Signed)
Epidural Patient location during procedure: OB Start time: 11/23/2012 2:56 PM  Staffing Anesthesiologist: Daved Mcfann A. Performed by: anesthesiologist   Preanesthetic Checklist Completed: patient identified, site marked, surgical consent, pre-op evaluation, timeout performed, IV checked, risks and benefits discussed and monitors and equipment checked  Epidural Patient position: sitting Prep: site prepped and draped and DuraPrep Patient monitoring: continuous pulse ox and blood pressure Approach: midline Injection technique: LOR air  Needle:  Needle type: Tuohy  Needle gauge: 17 G Needle length: 9 cm and 9 Needle insertion depth: 5 cm cm Catheter type: closed end flexible Catheter size: 19 Gauge Catheter at skin depth: 10 cm Test dose: negative and Other  Assessment Events: blood not aspirated, injection not painful, no injection resistance, negative IV test and no paresthesia  Additional Notes Patient identified. Risks and benefits discussed including failed block, incomplete  Pain control, post dural puncture headache, nerve damage, paralysis, blood pressure Changes, nausea, vomiting, reactions to medications-both toxic and allergic and post Partum back pain. All questions were answered. Patient expressed understanding and wished to proceed. Sterile technique was used throughout procedure. Epidural site was Dressed with sterile barrier dressing. No paresthesias, signs of intravascular injection Or signs of intrathecal spread were encountered.  Patient was more comfortable after the epidural was dosed. Please see RN's note for documentation of vital signs and FHR which are stable.

## 2012-11-23 NOTE — Progress Notes (Signed)
SVD of viable female infant w/ apgars of 4,8. Nuchal cord x 1 easily reduced.   When Infant slow to respond to suction and stimulation, Code apgar was called and team arrived quickly. NICU team present within 2 min of delivery to continue resuscitation Arterial pH:  7.12, Venous pH:  7.15 Placenta delivered spontaneous w/ 3VC.   1st degree & right labial lac repaired w/ 3-0 vicryl rapide.  Fundus firm.  EBL 400cc .

## 2012-11-23 NOTE — MAU Note (Signed)
Was contracting last night. Started again at 0300, now q 3-6. No bleeding or leaking. First baby, was 2 cm on Tues. No problems with preg.

## 2012-11-23 NOTE — H&P (Signed)
Kelsey Dodson is a 32 y.o. female presenting for labor.  Pregnancy uncomplicated.  No vb or lof.  History OB History   Grav Para Term Preterm Abortions TAB SAB Ect Mult Living   2    1  1    0     Past Medical History  Diagnosis Date  . Abnormal Pap smear    Past Surgical History  Procedure Laterality Date  . Leep  2005  . Dilation and evacuation  06/18/2011    Procedure: DILATATION AND EVACUATION;  Surgeon: Juluis Mire, MD;  Location: [facility];  Service: Gynecology;  Laterality: N/A;   Family History: family history is not on file. Social History:  reports that she has never smoked. She has never used smokeless tobacco. She reports that she does not drink alcohol or use illicit drugs.   Prenatal Transfer Tool  Maternal Diabetes: No Genetic Screening: Declined Maternal Ultrasounds/Referrals: Normal Fetal Ultrasounds or other Referrals:  None Maternal Substance Abuse:  No Significant Maternal Medications:  None Significant Maternal Lab Results:  None Other Comments:  None  ROS  Dilation: 5 Effacement (%): 90 Station: -1 Exam by:: Nysir Fergusson Blood pressure 134/73, pulse 70, temperature 98.4 F (36.9 C), temperature source Oral, resp. rate 18, height 5\' 7"  (1.702 m), weight 94.348 kg (208 lb). Exam Physical Exam  Prenatal labs: ABO, Rh:   Antibody:   Rubella:   RPR:    HBsAg:    HIV:    GBS: Negative (09/04 0000)   Assessment/Plan: Admit Exp mngt Epidural prn   Yohance Hathorne 11/23/2012, 2:32 PM

## 2012-11-23 NOTE — Anesthesia Preprocedure Evaluation (Signed)

## 2012-11-24 LAB — CBC
HCT: 29.9 % — ABNORMAL LOW (ref 36.0–46.0)
MCH: 29.4 pg (ref 26.0–34.0)
MCHC: 34.1 g/dL (ref 30.0–36.0)
Platelets: 221 10*3/uL (ref 150–400)
RDW: 14.3 % (ref 11.5–15.5)

## 2012-11-24 LAB — TYPE AND SCREEN
ABO/RH(D): A POS
Antibody Screen: NEGATIVE

## 2012-11-24 NOTE — Progress Notes (Signed)
Post Partum Day 1 Subjective: no complaints, up ad lib, voiding and tolerating PO  Objective: Blood pressure 130/81, pulse 88, temperature 98.7 F (37.1 C), temperature source Oral, resp. rate 18, height 5\' 7"  (1.702 m), weight 208 lb (94.348 kg), SpO2 100.00%, unknown if currently breastfeeding.  Physical Exam:  General: alert and cooperative Lochia: appropriate Uterine Fundus: firm Incision: perineum intact DVT Evaluation: No evidence of DVT seen on physical exam. Negative Homan's sign. No cords or calf tenderness. No significant calf/ankle edema.   Recent Labs  11/23/12 1205 11/24/12 0550  HGB 11.7* 10.2*  HCT 34.1* 29.9*    Assessment/Plan: Plan for discharge tomorrow   LOS: 1 day   Ab Leaming G 11/24/2012, 8:01 AM

## 2012-11-24 NOTE — Anesthesia Postprocedure Evaluation (Signed)
  Anesthesia Post-op Note  Anesthesia Post Note  Patient: Kelsey Dodson  Procedure(s) Performed: * No procedures listed *  Anesthesia type: Epidural  Patient location: Mother/Baby  Post pain: Pain level controlled  Post assessment: Post-op Vital signs reviewed  Last Vitals:  Filed Vitals:   11/24/12 0400  BP: 130/81  Pulse: 88  Temp: 37.1 C  Resp: 18    Post vital signs: Reviewed  Level of consciousness:alert  Complications: No apparent anesthesia complications

## 2012-11-25 MED ORDER — IBUPROFEN 600 MG PO TABS: 600.0000 mg | ORAL_TABLET | Freq: Four times a day (QID) | ORAL | Status: DC

## 2012-11-25 NOTE — Discharge Summary (Signed)
Obstetric Discharge Summary Reason for Admission: onset of labor Prenatal Procedures: ultrasound Intrapartum Procedures: spontaneous vaginal delivery Postpartum Procedures: none Complications-Operative and Postpartum: 1 degree perineal laceration Hemoglobin  Date Value Range Status  11/24/2012 10.2* 12.0 - 15.0 Dodson/dL Final     HCT  Date Value Range Status  11/24/2012 29.9* 36.0 - 46.0 % Final    Physical Exam:  General: alert and cooperative Lochia: appropriate Uterine Fundus: firm Incision: perineum intact DVT Evaluation: No evidence of DVT seen on physical exam. Negative Homan's sign. No cords or calf tenderness. No significant calf/ankle edema.  Discharge Diagnoses: Term Pregnancy-delivered  Discharge Information: Date: 11/25/2012 Activity: pelvic rest Diet: routine Medications: PNV and Ibuprofen Condition: stable Instructions: refer to practice specific booklet Discharge to: home   Newborn Data: Live born female  Birth Weight: 6 lb 15.6 oz (3164 Dodson) APGAR: 4, 8  Home with mother.  Kelsey Dodson 11/25/2012, 8:03 AM

## 2012-11-28 ENCOUNTER — Inpatient Hospital Stay (HOSPITAL_COMMUNITY): Admission: RE | Admit: 2012-11-28 | Payer: BC Managed Care – PPO | Source: Ambulatory Visit

## 2014-01-01 ENCOUNTER — Encounter (HOSPITAL_COMMUNITY): Payer: Self-pay | Admitting: *Deleted

## 2014-01-04 ENCOUNTER — Other Ambulatory Visit: Payer: Self-pay | Admitting: Obstetrics and Gynecology

## 2014-01-05 LAB — CYTOLOGY - PAP

## 2017-03-29 ENCOUNTER — Other Ambulatory Visit (HOSPITAL_COMMUNITY): Payer: Self-pay | Admitting: Internal Medicine

## 2017-03-29 DIAGNOSIS — E0590 Thyrotoxicosis, unspecified without thyrotoxic crisis or storm: Secondary | ICD-10-CM

## 2017-04-07 ENCOUNTER — Encounter (HOSPITAL_COMMUNITY)
Admission: RE | Admit: 2017-04-07 | Discharge: 2017-04-07 | Disposition: A | Discharge status: Home / Self Care | Payer: BLUE CROSS/BLUE SHIELD | Source: Ambulatory Visit | Attending: Internal Medicine | Admitting: Internal Medicine

## 2017-04-07 DIAGNOSIS — E0590 Thyrotoxicosis, unspecified without thyrotoxic crisis or storm: Secondary | ICD-10-CM | POA: Insufficient documentation

## 2017-04-07 MED ORDER — SODIUM IODIDE I 131 CAPSULE
17.0000 | Freq: Once | INTRAVENOUS | Status: AC | PRN
  Administered 2017-04-07: 17 via ORAL

## 2017-04-08 ENCOUNTER — Encounter (HOSPITAL_COMMUNITY)
Admission: RE | Admit: 2017-04-08 | Discharge: 2017-04-08 | Disposition: A | Discharge status: Home / Self Care | Payer: BLUE CROSS/BLUE SHIELD | Source: Ambulatory Visit | Attending: Internal Medicine | Admitting: Internal Medicine

## 2017-04-08 MED ORDER — SODIUM PERTECHNETATE TC 99M INJECTION
11.0000 | Freq: Once | INTRAVENOUS | Status: AC | PRN
  Administered 2017-04-08: 11 via INTRAVENOUS

## 2018-03-15 DIAGNOSIS — E0590 Thyrotoxicosis, unspecified without thyrotoxic crisis or storm: Secondary | ICD-10-CM | POA: Diagnosis not present

## 2018-03-23 DIAGNOSIS — E0500 Thyrotoxicosis with diffuse goiter without thyrotoxic crisis or storm: Secondary | ICD-10-CM | POA: Diagnosis not present

## 2018-03-23 DIAGNOSIS — Z6829 Body mass index (BMI) 29.0-29.9, adult: Secondary | ICD-10-CM | POA: Diagnosis not present

## 2018-03-23 DIAGNOSIS — E0590 Thyrotoxicosis, unspecified without thyrotoxic crisis or storm: Secondary | ICD-10-CM | POA: Diagnosis not present

## 2018-03-30 DIAGNOSIS — R3121 Asymptomatic microscopic hematuria: Secondary | ICD-10-CM | POA: Diagnosis not present

## 2018-04-04 DIAGNOSIS — R3121 Asymptomatic microscopic hematuria: Secondary | ICD-10-CM | POA: Diagnosis not present

## 2018-04-04 DIAGNOSIS — N200 Calculus of kidney: Secondary | ICD-10-CM | POA: Diagnosis not present

## 2018-04-26 ENCOUNTER — Encounter (HOSPITAL_COMMUNITY): Payer: Self-pay | Admitting: *Deleted

## 2018-04-26 ENCOUNTER — Inpatient Hospital Stay (HOSPITAL_COMMUNITY): Payer: BLUE CROSS/BLUE SHIELD

## 2018-04-26 ENCOUNTER — Inpatient Hospital Stay (HOSPITAL_COMMUNITY)
Admission: AD | Admit: 2018-04-26 | Discharge: 2018-04-26 | Disposition: A | Discharge status: Home Health | Payer: BLUE CROSS/BLUE SHIELD | Attending: Obstetrics and Gynecology | Admitting: Obstetrics and Gynecology

## 2018-04-26 ENCOUNTER — Other Ambulatory Visit: Payer: Self-pay

## 2018-04-26 DIAGNOSIS — O4690 Antepartum hemorrhage, unspecified, unspecified trimester: Secondary | ICD-10-CM

## 2018-04-26 DIAGNOSIS — Z3A01 Less than 8 weeks gestation of pregnancy: Secondary | ICD-10-CM | POA: Insufficient documentation

## 2018-04-26 DIAGNOSIS — O200 Threatened abortion: Secondary | ICD-10-CM | POA: Diagnosis not present

## 2018-04-26 DIAGNOSIS — O209 Hemorrhage in early pregnancy, unspecified: Secondary | ICD-10-CM | POA: Diagnosis not present

## 2018-04-26 DIAGNOSIS — Z6790 Unspecified blood type, Rh positive: Secondary | ICD-10-CM

## 2018-04-26 HISTORY — DX: Thyrotoxicosis, unspecified without thyrotoxic crisis or storm: E05.90

## 2018-04-26 LAB — WET PREP, GENITAL
Clue Cells Wet Prep HPF POC: NONE SEEN
Sperm: NONE SEEN
Trich, Wet Prep: NONE SEEN
Yeast Wet Prep HPF POC: NONE SEEN

## 2018-04-26 LAB — URINALYSIS, ROUTINE W REFLEX MICROSCOPIC
Bacteria, UA: NONE SEEN
Bilirubin Urine: NEGATIVE
GLUCOSE, UA: NEGATIVE mg/dL
Ketones, ur: NEGATIVE mg/dL
Leukocytes,Ua: NEGATIVE
NITRITE: NEGATIVE
Protein, ur: NEGATIVE mg/dL
Specific Gravity, Urine: 1.018 (ref 1.005–1.030)
pH: 6 (ref 5.0–8.0)

## 2018-04-26 LAB — CBC
HCT: 41.1 % (ref 36.0–46.0)
HEMOGLOBIN: 13.1 g/dL (ref 12.0–15.0)
MCH: 29.5 pg (ref 26.0–34.0)
MCHC: 31.9 g/dL (ref 30.0–36.0)
MCV: 92.6 fL (ref 80.0–100.0)
PLATELETS: 296 10*3/uL (ref 150–400)
RBC: 4.44 MIL/uL (ref 3.87–5.11)
RDW: 11.9 % (ref 11.5–15.5)
WBC: 10.1 10*3/uL (ref 4.0–10.5)
nRBC: 0 % (ref 0.0–0.2)

## 2018-04-26 LAB — POCT PREGNANCY, URINE: PREG TEST UR: POSITIVE — AB

## 2018-04-26 LAB — HCG, QUANTITATIVE, PREGNANCY: hCG, Beta Chain, Quant, S: 819 m[IU]/mL — ABNORMAL HIGH (ref <5)

## 2018-04-26 NOTE — MAU Note (Signed)
Is 7 wks preg.  Last night before bed, had some low back pain.  At 0340, got up and used the restroom, had some white mucous with a tinge of pink. Has progressed to a darker red.  Every time she uses the restroom, when she wipes- she sees red, has had 2 red spots.  Dr's office told her to come.no pain now.

## 2018-04-26 NOTE — Discharge Instructions (Signed)
Threatened Miscarriage  A threatened miscarriage occurs when a woman has vaginal bleeding during the first 20 weeks of pregnancy but the pregnancy has not ended. If you have vaginal bleeding during this time, your health care provider will do tests to make sure you are still pregnant. If the tests show that you are still pregnant and that the developing baby (fetus) inside your uterus is still growing, your condition is considered a threatened miscarriage. A threatened miscarriage does not mean your pregnancy will end, but it does increase the risk of losing your pregnancy (complete miscarriage). What are the causes? The cause of this condition is usually not known. For women who go on to have a complete miscarriage, the most common cause is an abnormal number of chromosomes in the developing baby. Chromosomes are the structures inside cells that hold all of a person's genetic material. What increases the risk? The following lifestyle factors may increase your risk of a miscarriage in early pregnancy:  Smoking.  Drinking excessive amounts of alcohol or caffeine.  Recreational drug use. The following preexisting health conditions may increase your risk of a miscarriage in early pregnancy:  Polycystic ovary syndrome.  Uterine fibroids.  Infections.  Diabetes mellitus. What are the signs or symptoms? Symptoms of this condition include:  Vaginal bleeding.  Mild abdominal pain or cramps. How is this diagnosed? If you have bleeding with or without abdominal pain before 20 weeks of pregnancy, your health care provider will do tests to check whether you are still pregnant. These will include:  Ultrasound. This test uses sound waves to create images of the inside of your uterus. This allows your health care provider to look at your developing baby and other structures, such as your placenta.  Pelvic exam. This is an internal exam of your vagina and cervix.  Measurement of your baby's heart  rate.  Laboratory tests such as blood tests, urine tests, or swabs for infection You may be diagnosed with a threatened miscarriage if:  Ultrasound testing shows that you are still pregnant.  Your baby's heart rate is strong.  A pelvic exam shows that the opening between your uterus and your vagina (cervix) is closed.  Blood tests confirm that you are still pregnant. How is this treated? No treatments have been shown to prevent a threatened miscarriage from going on to a complete miscarriage. However, the right home care is important. Follow these instructions at home:  Get plenty of rest.  Do not have sex or use tampons if you have vaginal bleeding.  Do not douche.  Do not smoke or use recreational drugs.  Do not drink alcohol.  Avoid caffeine.  Keep all follow-up prenatal visits as told by your health care provider. This is important. Contact a health care provider if:  You have light vaginal bleeding or spotting while pregnant.  You have abdominal pain or cramping.  You have a fever. Get help right away if:  You have heavy vaginal bleeding.  You have blood clots coming from your vagina.  You pass tissue from your vagina.  You leak fluid, or you have a gush of fluid from your vagina.  You have severe low back pain or abdominal cramps.  You have fever, chills, and severe abdominal pain. Summary  A threatened miscarriage occurs when a woman has vaginal bleeding during the first 20 weeks of pregnancy but the pregnancy has not ended.  The cause of a threatened miscarriage is usually not known.  Symptoms of this condition may   include vaginal bleeding and mild abdominal pain or cramps.  No treatments have been shown to prevent a threatened miscarriage from going on to a complete miscarriage.  Keep all follow-up prenatal visits as told by your health care provider. This is important. This information is not intended to replace advice given to you by your health  care provider. Make sure you discuss any questions you have with your health care provider. Document Released: 02/16/2005 Document Revised: 05/15/2016 Document Reviewed: 05/15/2016 Elsevier Interactive Patient Education  2019 [employer]  

## 2018-04-26 NOTE — MAU Provider Note (Addendum)
History     CSN: [id number]  Arrival date and time: 04/26/18 6759   First Provider Initiated Contact with Patient 04/26/18 1004      Chief Complaint  Patient presents with  . Vaginal Bleeding   37 y.o. [id number] @[redacted]w[redacted]d  by sure LMP presently with VB. Reports onset of light pink bleeding when she wiped this am. Blood has become red today but not requiring a pad. Last night she had LBP, none today. No IC in 2 days.  OB History    Gravida  3   Para  1   Term  1   Preterm      AB  1   Living  1     SAB  1   TAB      Ectopic      Multiple      Live Births  1           Past Medical History:  Diagnosis Date  . Abnormal Pap smear   . Hyperthyroidism     Past Surgical History:  Procedure Laterality Date  . DILATION AND EVACUATION  06/18/2011   Procedure: DILATATION AND EVACUATION;  Surgeon: Juluis Mire, MD;  Location: [facility];  Service: Gynecology;  Laterality: N/A;  . LEEP  2005    History reviewed. No pertinent family history.  Social History   Tobacco Use  . Smoking status: Never Smoker  . Smokeless tobacco: Never Used  Substance Use Topics  . Alcohol use: No  . Drug use: No    Allergies:  Allergies  Allergen Reactions  . Levofloxacin Hives  . Macrobid [Nitrofurantoin] Hives    No medications prior to admission.    Review of Systems  Constitutional: Negative for chills and fever.  Gastrointestinal: Negative for abdominal pain.  Genitourinary: Positive for vaginal bleeding.   Physical Exam   Blood pressure 134/72, pulse 77, temperature 98.7 F (37.1 C), temperature source Oral, resp. rate 16, height 5\' 7"  (1.702 m), weight 90 kg, last menstrual period 03/07/2018, SpO2 99 %.  Physical Exam  Nursing note and vitals reviewed. Constitutional: She is oriented to person, place, and time. She appears well-developed and well-nourished. No distress.  HENT:  Head: Normocephalic and atraumatic.  Neck: Normal range of motion.  Cardiovascular:  Normal rate.  Respiratory: Effort normal. No respiratory distress.  GI: Soft. She exhibits no distension and no mass. There is no abdominal tenderness. There is no rebound and no guarding.  Genitourinary:    Genitourinary Comments: External: no lesions or erythema Vagina: rugated, pink, moist, small bloody discharge, cleared with 2 fox swabs Uterus: non enlarged, anteverted, non tender, no CMT Adnexae: no masses, no tenderness left, no tenderness right Cervix closed, normal    Musculoskeletal: Normal range of motion.  Neurological: She is alert and oriented to person, place, and time.  Skin: Skin is warm and dry.  Psychiatric: She has a normal mood and affect.   Results for orders placed or performed during the hospital encounter of 04/26/18 (from the past 24 hour(s))  Urinalysis, Routine w reflex microscopic     Status: Abnormal   Collection Time: 04/26/18  8:47 AM  Result Value Ref Range   Color, Urine YELLOW YELLOW   APPearance CLEAR CLEAR   Specific Gravity, Urine 1.018 1.005 - 1.030   pH 6.0 5.0 - 8.0   Glucose, UA NEGATIVE NEGATIVE mg/dL   Hgb urine dipstick LARGE (A) NEGATIVE   Bilirubin Urine NEGATIVE NEGATIVE   Ketones, ur NEGATIVE NEGATIVE  mg/dL   Protein, ur NEGATIVE NEGATIVE mg/dL   Nitrite NEGATIVE NEGATIVE   Leukocytes,Ua NEGATIVE NEGATIVE   RBC / HPF >50 (H) 0 - 5 RBC/hpf   WBC, UA 0-5 0 - 5 WBC/hpf   Bacteria, UA NONE SEEN NONE SEEN   Squamous Epithelial / LPF 0-5 0 - 5   Mucus PRESENT   Pregnancy, urine POC     Status: Abnormal   Collection Time: 04/26/18  9:21 AM  Result Value Ref Range   Preg Test, Ur POSITIVE (A) NEGATIVE  Wet prep, genital     Status: Abnormal   Collection Time: 04/26/18 10:04 AM  Result Value Ref Range   Yeast Wet Prep HPF POC NONE SEEN NONE SEEN   Trich, Wet Prep NONE SEEN NONE SEEN   Clue Cells Wet Prep HPF POC NONE SEEN NONE SEEN   WBC, Wet Prep HPF POC MANY (A) NONE SEEN   Sperm NONE SEEN   CBC     Status: None   Collection  Time: 04/26/18 10:35 AM  Result Value Ref Range   WBC 10.1 4.0 - 10.5 K/uL   RBC 4.44 3.87 - 5.11 MIL/uL   Hemoglobin 13.1 12.0 - 15.0 g/dL   HCT 67.3 41.9 - 37.9 %   MCV 92.6 80.0 - 100.0 fL   MCH 29.5 26.0 - 34.0 pg   MCHC 31.9 30.0 - 36.0 g/dL   RDW 02.4 09.7 - 35.3 %   Platelets 296 150 - 400 K/uL   nRBC 0.0 0.0 - 0.2 %  hCG, quantitative, pregnancy     Status: Abnormal   Collection Time: 04/26/18 10:35 AM  Result Value Ref Range   hCG, Beta Chain, Quant, S 819 (H) <5 mIU/mL  US Ob Less Than 14 Weeks With Ob Transvaginal  Result Date: 04/26/2018 CLINICAL DATA:  Vaginal bleeding. Gestational age by LMP of 7 weeks 1 day. EXAM: OBSTETRIC <14 WK [place] AND TRANSVAGINAL OB US TECHNIQUE: Both transabdominal and transvaginal ultrasound examinations were performed for complete evaluation of the gestation as well as the maternal uterus, adnexal regions, and pelvic cul-de-sac. Transvaginal technique was performed to assess early pregnancy. COMPARISON:  None. FINDINGS: Intrauterine gestational sac: Single, irregular sac shape noted Yolk sac:  Visualized. Embryo:  Not Visualized. MSD: 6 mm   5 w   2 d Subchorionic hemorrhage:  None visualized. Maternal uterus/adnexae: Both ovaries are normal in appearance. No mass or abnormal free fluid identified. IMPRESSION: Single intrauterine gestational sac measuring 5 weeks 2 days by mean sac diameter. Irregular sac shape is a poor prognostic sign. Suggest correlation with serial b-hCG levels, and consider followup ultrasound to assess viability in 10 days. Electronically Signed   By: Myles Rosenthal M.D.   On: 04/26/2018 11:38   MAU Course  Procedures Orders Placed This Encounter  Procedures  . Wet prep, genital    Standing Status:   Standing    Number of Occurrences:   1    Order Specific Question:   Patient immune status    Answer:   Normal  . US OB LESS THAN 14 WEEKS WITH OB TRANSVAGINAL    Standing Status:   Standing    Number of Occurrences:   1    Order  Specific Question:   Symptom/Reason for Exam    Answer:   Vaginal bleeding in pregnancy [705036]  . Urinalysis, Routine w reflex microscopic    Standing Status:   Standing    Number of Occurrences:   1  .  CBC    Standing Status:   Standing    Number of Occurrences:   1  . hCG, quantitative, pregnancy    Standing Status:   Standing    Number of Occurrences:   1  . Pregnancy, urine POC    Standing Status:   Standing    Number of Occurrences:   1  . Discharge patient    Order Specific Question:   Discharge disposition    Answer:   01-Home or Self Care [1]    Order Specific Question:   Discharge patient date    Answer:   04/26/2018   MDM Labs and [place] ordered and reviewed. IUP seen on [place] but IUGS irregular shape, no FP seen, and size less than dates all concerning for impending SAB. Discussed findings with pt and spouse. Stable for discharge home.   Assessment and Plan   1. Threatened miscarriage   2. Vaginal bleeding in pregnancy   3. Blood type, Rh positive    Discharge home Follow up at [facility] in 7-10 days for [place]- notified office for f/u appt, they will call pt SAB/return precautions Pelvic rest  Allergies as of 04/26/2018      Reactions   Levofloxacin Hives   Macrobid [nitrofurantoin] Hives      Medication List    STOP taking these medications   ibuprofen 600 MG tablet Commonly known as:  ADVIL,MOTRIN     TAKE these medications   prenatal multivitamin Tabs tablet Take 1 tablet by mouth daily.   propylthiouracil 50 MG tablet Commonly known as:  PTU Take 50 mg by mouth 2 (two) times daily.      Donette Larry, CNM 04/26/2018, 1:44 PM

## 2018-04-28 LAB — GC/CHLAMYDIA PROBE AMP (~~LOC~~) NOT AT ARMC
Chlamydia: NEGATIVE
Neisseria Gonorrhea: NEGATIVE

## 2018-05-05 DIAGNOSIS — O039 Complete or unspecified spontaneous abortion without complication: Secondary | ICD-10-CM | POA: Diagnosis not present

## 2018-05-05 DIAGNOSIS — N911 Secondary amenorrhea: Secondary | ICD-10-CM | POA: Diagnosis not present

## 2018-07-04 DIAGNOSIS — N200 Calculus of kidney: Secondary | ICD-10-CM | POA: Diagnosis not present

## 2018-07-04 DIAGNOSIS — R3121 Asymptomatic microscopic hematuria: Secondary | ICD-10-CM | POA: Diagnosis not present

## 2018-07-14 DIAGNOSIS — E0590 Thyrotoxicosis, unspecified without thyrotoxic crisis or storm: Secondary | ICD-10-CM | POA: Diagnosis not present

## 2018-07-21 DIAGNOSIS — E0590 Thyrotoxicosis, unspecified without thyrotoxic crisis or storm: Secondary | ICD-10-CM | POA: Diagnosis not present

## 2018-07-21 DIAGNOSIS — E0500 Thyrotoxicosis with diffuse goiter without thyrotoxic crisis or storm: Secondary | ICD-10-CM | POA: Diagnosis not present

## 2018-07-21 DIAGNOSIS — Z6829 Body mass index (BMI) 29.0-29.9, adult: Secondary | ICD-10-CM | POA: Diagnosis not present

## 2018-08-29 DIAGNOSIS — O200 Threatened abortion: Secondary | ICD-10-CM | POA: Diagnosis not present

## 2018-09-01 DIAGNOSIS — Z3009 Encounter for other general counseling and advice on contraception: Secondary | ICD-10-CM | POA: Diagnosis not present

## 2018-09-06 DIAGNOSIS — Z30430 Encounter for insertion of intrauterine contraceptive device: Secondary | ICD-10-CM | POA: Diagnosis not present

## 2018-09-28 DIAGNOSIS — Z30431 Encounter for routine checking of intrauterine contraceptive device: Secondary | ICD-10-CM | POA: Diagnosis not present

## 2018-10-19 DIAGNOSIS — E0590 Thyrotoxicosis, unspecified without thyrotoxic crisis or storm: Secondary | ICD-10-CM | POA: Diagnosis not present

## 2018-10-24 DIAGNOSIS — E0590 Thyrotoxicosis, unspecified without thyrotoxic crisis or storm: Secondary | ICD-10-CM | POA: Diagnosis not present

## 2018-10-24 DIAGNOSIS — Z6829 Body mass index (BMI) 29.0-29.9, adult: Secondary | ICD-10-CM | POA: Diagnosis not present

## 2018-10-24 DIAGNOSIS — E0500 Thyrotoxicosis with diffuse goiter without thyrotoxic crisis or storm: Secondary | ICD-10-CM | POA: Diagnosis not present

## 2018-12-23 DIAGNOSIS — E0590 Thyrotoxicosis, unspecified without thyrotoxic crisis or storm: Secondary | ICD-10-CM | POA: Diagnosis not present

## 2019-04-24 DIAGNOSIS — E0590 Thyrotoxicosis, unspecified without thyrotoxic crisis or storm: Secondary | ICD-10-CM | POA: Diagnosis not present

## 2019-05-01 DIAGNOSIS — E0590 Thyrotoxicosis, unspecified without thyrotoxic crisis or storm: Secondary | ICD-10-CM | POA: Diagnosis not present

## 2019-05-01 DIAGNOSIS — E0500 Thyrotoxicosis with diffuse goiter without thyrotoxic crisis or storm: Secondary | ICD-10-CM | POA: Diagnosis not present

## 2019-05-01 DIAGNOSIS — Z6829 Body mass index (BMI) 29.0-29.9, adult: Secondary | ICD-10-CM | POA: Diagnosis not present

## 2019-05-01 DIAGNOSIS — Z7189 Other specified counseling: Secondary | ICD-10-CM | POA: Diagnosis not present

## 2019-05-04 ENCOUNTER — Other Ambulatory Visit: Payer: Self-pay

## 2019-05-04 ENCOUNTER — Ambulatory Visit: Payer: BC Managed Care – PPO | Attending: Family

## 2019-05-04 DIAGNOSIS — Z23 Encounter for immunization: Secondary | ICD-10-CM | POA: Insufficient documentation

## 2019-05-04 NOTE — Progress Notes (Signed)
   Covid-19 Vaccination Clinic  Name:  Kelsey Dodson    MRN: [id number] DOB: 1980-07-14  05/04/2019  Ms. Lisbon was observed post Covid-19 immunization for 15 minutes without incident. She was provided with Vaccine Information Sheet and instruction to access the V-Safe system.   Ms. Offer was instructed to call 911 with any severe reactions post vaccine: Marland Kitchen Difficulty breathing  . Swelling of face and throat  . A fast heartbeat  . A bad rash all over body  . Dizziness and weakness   Immunizations Administered    Name Date Dose VIS Date Route   Moderna COVID-19 Vaccine 05/04/2019 11:33 AM 0.5 mL 01/31/2019 Intramuscular   Manufacturer: Moderna   Lot: [id number]   NDC: 45809-983-38

## 2019-06-06 ENCOUNTER — Ambulatory Visit: Payer: BC Managed Care – PPO | Attending: Family

## 2019-06-06 DIAGNOSIS — Z23 Encounter for immunization: Secondary | ICD-10-CM

## 2019-06-06 NOTE — Progress Notes (Signed)
   Covid-19 Vaccination Clinic  Name:  PAM VANALSTINE    MRN: [id number] DOB: [birth year]  06/06/2019  Ms. Dantes was observed post Covid-19 immunization for 15 minutes without incident. She was provided with Vaccine Information Sheet and instruction to access the V-Safe system.   Ms. Tennison was instructed to call 911 with any severe reactions post vaccine: Marland Kitchen Difficulty breathing  . Swelling of face and throat  . A fast heartbeat  . A bad rash all over body  . Dizziness and weakness   Immunizations Administered    Name Date Dose VIS Date Route   Moderna COVID-19 Vaccine 06/06/2019 11:43 AM 0.5 mL 01/31/2019 Intramuscular   Manufacturer: Moderna   Lot: [id number]   NDC: 67014-103-01

## 2019-06-21 DIAGNOSIS — E0590 Thyrotoxicosis, unspecified without thyrotoxic crisis or storm: Secondary | ICD-10-CM | POA: Diagnosis not present

## 2019-06-27 DIAGNOSIS — Z6831 Body mass index (BMI) 31.0-31.9, adult: Secondary | ICD-10-CM | POA: Diagnosis not present

## 2019-06-27 DIAGNOSIS — E0500 Thyrotoxicosis with diffuse goiter without thyrotoxic crisis or storm: Secondary | ICD-10-CM | POA: Diagnosis not present

## 2019-06-27 DIAGNOSIS — Z7189 Other specified counseling: Secondary | ICD-10-CM | POA: Diagnosis not present

## 2019-06-27 DIAGNOSIS — E0590 Thyrotoxicosis, unspecified without thyrotoxic crisis or storm: Secondary | ICD-10-CM | POA: Diagnosis not present

## 2019-08-01 ENCOUNTER — Other Ambulatory Visit (HOSPITAL_COMMUNITY): Payer: Self-pay | Admitting: Internal Medicine

## 2019-08-01 ENCOUNTER — Other Ambulatory Visit: Payer: Self-pay | Admitting: Internal Medicine

## 2019-08-01 DIAGNOSIS — E0590 Thyrotoxicosis, unspecified without thyrotoxic crisis or storm: Secondary | ICD-10-CM

## 2019-08-09 ENCOUNTER — Other Ambulatory Visit (HOSPITAL_COMMUNITY): Payer: BC Managed Care – PPO

## 2019-08-09 ENCOUNTER — Ambulatory Visit (HOSPITAL_COMMUNITY): Payer: BC Managed Care – PPO

## 2019-08-10 ENCOUNTER — Other Ambulatory Visit (HOSPITAL_COMMUNITY): Payer: BC Managed Care – PPO

## 2019-10-23 DIAGNOSIS — E0590 Thyrotoxicosis, unspecified without thyrotoxic crisis or storm: Secondary | ICD-10-CM | POA: Diagnosis not present

## 2019-10-23 DIAGNOSIS — N951 Menopausal and female climacteric states: Secondary | ICD-10-CM | POA: Diagnosis not present

## 2019-10-30 DIAGNOSIS — E0500 Thyrotoxicosis with diffuse goiter without thyrotoxic crisis or storm: Secondary | ICD-10-CM | POA: Diagnosis not present

## 2019-10-30 DIAGNOSIS — E0590 Thyrotoxicosis, unspecified without thyrotoxic crisis or storm: Secondary | ICD-10-CM | POA: Diagnosis not present

## 2019-11-08 ENCOUNTER — Encounter (HOSPITAL_COMMUNITY): Admission: RE | Admit: 2019-11-08 | Payer: BC Managed Care – PPO | Source: Ambulatory Visit

## 2019-11-08 ENCOUNTER — Encounter (HOSPITAL_COMMUNITY): Payer: BC Managed Care – PPO

## 2019-11-09 ENCOUNTER — Encounter (HOSPITAL_COMMUNITY)
Admission: RE | Admit: 2019-11-09 | Discharge: 2019-11-09 | Disposition: A | Discharge status: Home / Self Care | Payer: BC Managed Care – PPO | Source: Ambulatory Visit | Attending: Internal Medicine | Admitting: Internal Medicine

## 2019-11-09 ENCOUNTER — Encounter (HOSPITAL_COMMUNITY): Payer: BC Managed Care – PPO

## 2019-11-09 ENCOUNTER — Encounter (HOSPITAL_COMMUNITY): Payer: Self-pay

## 2019-11-09 ENCOUNTER — Other Ambulatory Visit: Payer: Self-pay

## 2019-11-09 DIAGNOSIS — E0590 Thyrotoxicosis, unspecified without thyrotoxic crisis or storm: Secondary | ICD-10-CM | POA: Diagnosis not present

## 2019-11-09 MED ORDER — SODIUM IODIDE I-123 7.4 MBQ CAPS
447.1000 | ORAL_CAPSULE | Freq: Once | ORAL | Status: AC
  Administered 2019-11-09: 447.1 via ORAL

## 2019-11-10 ENCOUNTER — Encounter (HOSPITAL_COMMUNITY)
Admission: RE | Admit: 2019-11-10 | Discharge: 2019-11-10 | Disposition: A | Discharge status: Home / Self Care | Payer: BC Managed Care – PPO | Source: Ambulatory Visit | Attending: Internal Medicine | Admitting: Internal Medicine

## 2019-11-10 DIAGNOSIS — E0590 Thyrotoxicosis, unspecified without thyrotoxic crisis or storm: Secondary | ICD-10-CM | POA: Diagnosis not present

## 2019-11-16 ENCOUNTER — Other Ambulatory Visit (HOSPITAL_COMMUNITY): Payer: Self-pay | Admitting: Internal Medicine

## 2019-11-16 DIAGNOSIS — E0590 Thyrotoxicosis, unspecified without thyrotoxic crisis or storm: Secondary | ICD-10-CM

## 2019-11-16 DIAGNOSIS — E0500 Thyrotoxicosis with diffuse goiter without thyrotoxic crisis or storm: Secondary | ICD-10-CM

## 2019-11-27 ENCOUNTER — Other Ambulatory Visit: Payer: Self-pay

## 2019-11-27 ENCOUNTER — Encounter (HOSPITAL_COMMUNITY)
Admission: RE | Admit: 2019-11-27 | Discharge: 2019-11-27 | Disposition: A | Discharge status: Home / Self Care | Payer: BC Managed Care – PPO | Source: Ambulatory Visit | Attending: Internal Medicine | Admitting: Internal Medicine

## 2019-11-27 DIAGNOSIS — E0500 Thyrotoxicosis with diffuse goiter without thyrotoxic crisis or storm: Secondary | ICD-10-CM | POA: Insufficient documentation

## 2019-11-27 DIAGNOSIS — E0590 Thyrotoxicosis, unspecified without thyrotoxic crisis or storm: Secondary | ICD-10-CM | POA: Diagnosis not present

## 2019-11-27 LAB — HCG, SERUM, QUALITATIVE: Preg, Serum: NEGATIVE

## 2019-11-27 MED ORDER — SODIUM IODIDE I 131 CAPSULE
13.7000 | Freq: Once | INTRAVENOUS | Status: AC | PRN
  Administered 2019-11-27: 13:00:00 13.7 via ORAL

## 2019-12-25 DIAGNOSIS — E0590 Thyrotoxicosis, unspecified without thyrotoxic crisis or storm: Secondary | ICD-10-CM | POA: Diagnosis not present

## 2019-12-28 DIAGNOSIS — E0500 Thyrotoxicosis with diffuse goiter without thyrotoxic crisis or storm: Secondary | ICD-10-CM | POA: Diagnosis not present

## 2019-12-28 DIAGNOSIS — E0590 Thyrotoxicosis, unspecified without thyrotoxic crisis or storm: Secondary | ICD-10-CM | POA: Diagnosis not present

## 2019-12-28 DIAGNOSIS — Z6831 Body mass index (BMI) 31.0-31.9, adult: Secondary | ICD-10-CM | POA: Diagnosis not present

## 2020-01-23 DIAGNOSIS — E0500 Thyrotoxicosis with diffuse goiter without thyrotoxic crisis or storm: Secondary | ICD-10-CM | POA: Diagnosis not present

## 2020-01-23 DIAGNOSIS — R3129 Other microscopic hematuria: Secondary | ICD-10-CM | POA: Diagnosis not present

## 2020-01-23 DIAGNOSIS — Z23 Encounter for immunization: Secondary | ICD-10-CM | POA: Diagnosis not present

## 2020-02-05 DIAGNOSIS — E0500 Thyrotoxicosis with diffuse goiter without thyrotoxic crisis or storm: Secondary | ICD-10-CM | POA: Diagnosis not present

## 2020-02-05 DIAGNOSIS — Z6831 Body mass index (BMI) 31.0-31.9, adult: Secondary | ICD-10-CM | POA: Diagnosis not present

## 2020-02-05 DIAGNOSIS — E0590 Thyrotoxicosis, unspecified without thyrotoxic crisis or storm: Secondary | ICD-10-CM | POA: Diagnosis not present

## 2020-02-14 DIAGNOSIS — Z20822 Contact with and (suspected) exposure to covid-19: Secondary | ICD-10-CM | POA: Diagnosis not present

## 2020-03-07 ENCOUNTER — Ambulatory Visit: Payer: BC Managed Care – PPO | Attending: Internal Medicine

## 2020-03-07 DIAGNOSIS — Z23 Encounter for immunization: Secondary | ICD-10-CM

## 2020-03-07 NOTE — Progress Notes (Signed)
   Covid-19 Vaccination Clinic  Name:  Kelsey Dodson    MRN: [id number] DOB: 1980/10/27  03/07/2020  [name] was observed post Covid-19 immunization for 15 minutes without incident. She was provided with Vaccine Information Sheet and instruction to access the V-Safe system.   [name] was instructed to call 911 with any severe reactions post vaccine: Marland Kitchen Difficulty breathing  . Swelling of face and throat  . A fast heartbeat  . A bad rash all over body  . Dizziness and weakness   Immunizations Administered    Name Date Dose VIS Date Route   Moderna Covid-19 Booster Vaccine 03/07/2020  1:14 PM 0.25 mL 12/20/2019 Intramuscular   Manufacturer: Gala Murdoch   Lot: [id number]   NDC: 14431-540-08

## 2020-03-26 DIAGNOSIS — E0590 Thyrotoxicosis, unspecified without thyrotoxic crisis or storm: Secondary | ICD-10-CM | POA: Diagnosis not present

## 2020-04-02 DIAGNOSIS — E0500 Thyrotoxicosis with diffuse goiter without thyrotoxic crisis or storm: Secondary | ICD-10-CM | POA: Diagnosis not present

## 2020-04-02 DIAGNOSIS — Z923 Personal history of irradiation: Secondary | ICD-10-CM | POA: Diagnosis not present

## 2020-04-02 DIAGNOSIS — Z6831 Body mass index (BMI) 31.0-31.9, adult: Secondary | ICD-10-CM | POA: Diagnosis not present

## 2020-04-02 DIAGNOSIS — E0590 Thyrotoxicosis, unspecified without thyrotoxic crisis or storm: Secondary | ICD-10-CM | POA: Diagnosis not present

## 2020-04-11 DIAGNOSIS — Z20822 Contact with and (suspected) exposure to covid-19: Secondary | ICD-10-CM | POA: Diagnosis not present

## 2020-05-20 ENCOUNTER — Other Ambulatory Visit: Payer: BC Managed Care – PPO

## 2020-05-20 DIAGNOSIS — E0590 Thyrotoxicosis, unspecified without thyrotoxic crisis or storm: Secondary | ICD-10-CM | POA: Diagnosis not present

## 2020-05-21 DIAGNOSIS — Z20822 Contact with and (suspected) exposure to covid-19: Secondary | ICD-10-CM | POA: Diagnosis not present

## 2020-07-02 DIAGNOSIS — E0500 Thyrotoxicosis with diffuse goiter without thyrotoxic crisis or storm: Secondary | ICD-10-CM | POA: Diagnosis not present

## 2020-07-08 DIAGNOSIS — E0590 Thyrotoxicosis, unspecified without thyrotoxic crisis or storm: Secondary | ICD-10-CM | POA: Diagnosis not present

## 2020-07-08 DIAGNOSIS — E0500 Thyrotoxicosis with diffuse goiter without thyrotoxic crisis or storm: Secondary | ICD-10-CM | POA: Diagnosis not present

## 2020-07-08 DIAGNOSIS — E039 Hypothyroidism, unspecified: Secondary | ICD-10-CM | POA: Diagnosis not present

## 2020-07-08 DIAGNOSIS — Z923 Personal history of irradiation: Secondary | ICD-10-CM | POA: Diagnosis not present

## 2020-09-09 DIAGNOSIS — E0590 Thyrotoxicosis, unspecified without thyrotoxic crisis or storm: Secondary | ICD-10-CM | POA: Diagnosis not present

## 2020-09-23 DIAGNOSIS — M791 Myalgia, unspecified site: Secondary | ICD-10-CM | POA: Diagnosis not present

## 2020-09-23 DIAGNOSIS — E039 Hypothyroidism, unspecified: Secondary | ICD-10-CM | POA: Diagnosis not present

## 2020-09-30 DIAGNOSIS — R5383 Other fatigue: Secondary | ICD-10-CM | POA: Diagnosis not present

## 2020-09-30 DIAGNOSIS — G4700 Insomnia, unspecified: Secondary | ICD-10-CM | POA: Diagnosis not present

## 2020-09-30 DIAGNOSIS — E890 Postprocedural hypothyroidism: Secondary | ICD-10-CM | POA: Diagnosis not present

## 2020-09-30 DIAGNOSIS — M2550 Pain in unspecified joint: Secondary | ICD-10-CM | POA: Diagnosis not present

## 2020-10-29 DIAGNOSIS — Z01419 Encounter for gynecological examination (general) (routine) without abnormal findings: Secondary | ICD-10-CM | POA: Diagnosis not present

## 2020-10-29 DIAGNOSIS — Z13220 Encounter for screening for lipoid disorders: Secondary | ICD-10-CM | POA: Diagnosis not present

## 2020-10-29 DIAGNOSIS — Z6832 Body mass index (BMI) 32.0-32.9, adult: Secondary | ICD-10-CM | POA: Diagnosis not present

## 2020-10-29 DIAGNOSIS — Z13228 Encounter for screening for other metabolic disorders: Secondary | ICD-10-CM | POA: Diagnosis not present

## 2020-10-29 DIAGNOSIS — Z1321 Encounter for screening for nutritional disorder: Secondary | ICD-10-CM | POA: Diagnosis not present

## 2020-11-05 DIAGNOSIS — E0590 Thyrotoxicosis, unspecified without thyrotoxic crisis or storm: Secondary | ICD-10-CM | POA: Diagnosis not present

## 2020-11-07 DIAGNOSIS — M2550 Pain in unspecified joint: Secondary | ICD-10-CM | POA: Diagnosis not present

## 2020-11-07 DIAGNOSIS — E039 Hypothyroidism, unspecified: Secondary | ICD-10-CM | POA: Diagnosis not present

## 2020-11-07 DIAGNOSIS — R5383 Other fatigue: Secondary | ICD-10-CM | POA: Diagnosis not present

## 2020-11-07 DIAGNOSIS — E559 Vitamin D deficiency, unspecified: Secondary | ICD-10-CM | POA: Diagnosis not present

## 2020-11-07 DIAGNOSIS — E538 Deficiency of other specified B group vitamins: Secondary | ICD-10-CM | POA: Diagnosis not present

## 2020-11-14 DIAGNOSIS — Z6831 Body mass index (BMI) 31.0-31.9, adult: Secondary | ICD-10-CM | POA: Diagnosis not present

## 2020-11-14 DIAGNOSIS — Z8639 Personal history of other endocrine, nutritional and metabolic disease: Secondary | ICD-10-CM | POA: Diagnosis not present

## 2020-11-14 DIAGNOSIS — Z923 Personal history of irradiation: Secondary | ICD-10-CM | POA: Diagnosis not present

## 2020-11-14 DIAGNOSIS — E039 Hypothyroidism, unspecified: Secondary | ICD-10-CM | POA: Diagnosis not present

## 2020-11-27 DIAGNOSIS — Z1239 Encounter for other screening for malignant neoplasm of breast: Secondary | ICD-10-CM | POA: Diagnosis not present

## 2020-12-02 ENCOUNTER — Other Ambulatory Visit: Payer: Self-pay | Admitting: Obstetrics and Gynecology

## 2020-12-02 DIAGNOSIS — R928 Other abnormal and inconclusive findings on diagnostic imaging of breast: Secondary | ICD-10-CM

## 2020-12-03 ENCOUNTER — Ambulatory Visit: Payer: BC Managed Care – PPO

## 2020-12-03 ENCOUNTER — Ambulatory Visit
Admission: RE | Admit: 2020-12-03 | Discharge: 2020-12-03 | Disposition: A | Discharge status: Home / Self Care | Payer: BC Managed Care – PPO | Source: Ambulatory Visit | Attending: Obstetrics and Gynecology | Admitting: Obstetrics and Gynecology

## 2020-12-03 ENCOUNTER — Other Ambulatory Visit: Payer: Self-pay

## 2020-12-03 DIAGNOSIS — R922 Inconclusive mammogram: Secondary | ICD-10-CM | POA: Diagnosis not present

## 2020-12-03 DIAGNOSIS — R928 Other abnormal and inconclusive findings on diagnostic imaging of breast: Secondary | ICD-10-CM

## 2020-12-04 DIAGNOSIS — M359 Systemic involvement of connective tissue, unspecified: Secondary | ICD-10-CM | POA: Diagnosis not present

## 2020-12-04 DIAGNOSIS — R768 Other specified abnormal immunological findings in serum: Secondary | ICD-10-CM | POA: Diagnosis not present

## 2020-12-04 DIAGNOSIS — M25562 Pain in left knee: Secondary | ICD-10-CM | POA: Diagnosis not present

## 2020-12-04 DIAGNOSIS — R5383 Other fatigue: Secondary | ICD-10-CM | POA: Diagnosis not present

## 2020-12-04 DIAGNOSIS — M25561 Pain in right knee: Secondary | ICD-10-CM | POA: Diagnosis not present

## 2021-01-03 DIAGNOSIS — M25561 Pain in right knee: Secondary | ICD-10-CM | POA: Diagnosis not present

## 2021-01-03 DIAGNOSIS — R768 Other specified abnormal immunological findings in serum: Secondary | ICD-10-CM | POA: Diagnosis not present

## 2021-01-03 DIAGNOSIS — M359 Systemic involvement of connective tissue, unspecified: Secondary | ICD-10-CM | POA: Diagnosis not present

## 2021-01-03 DIAGNOSIS — R5383 Other fatigue: Secondary | ICD-10-CM | POA: Diagnosis not present

## 2021-01-10 DIAGNOSIS — M546 Pain in thoracic spine: Secondary | ICD-10-CM | POA: Diagnosis not present

## 2021-01-10 DIAGNOSIS — M542 Cervicalgia: Secondary | ICD-10-CM | POA: Diagnosis not present

## 2021-01-15 DIAGNOSIS — G5622 Lesion of ulnar nerve, left upper limb: Secondary | ICD-10-CM | POA: Diagnosis not present

## 2021-01-26 DIAGNOSIS — M542 Cervicalgia: Secondary | ICD-10-CM | POA: Diagnosis not present

## 2021-01-26 DIAGNOSIS — M546 Pain in thoracic spine: Secondary | ICD-10-CM | POA: Diagnosis not present

## 2021-01-27 DIAGNOSIS — R41840 Attention and concentration deficit: Secondary | ICD-10-CM | POA: Diagnosis not present

## 2021-01-27 DIAGNOSIS — Z0000 Encounter for general adult medical examination without abnormal findings: Secondary | ICD-10-CM | POA: Diagnosis not present

## 2021-01-27 DIAGNOSIS — G5622 Lesion of ulnar nerve, left upper limb: Secondary | ICD-10-CM | POA: Diagnosis not present

## 2021-01-27 DIAGNOSIS — R5383 Other fatigue: Secondary | ICD-10-CM | POA: Diagnosis not present

## 2021-01-27 DIAGNOSIS — G4700 Insomnia, unspecified: Secondary | ICD-10-CM | POA: Diagnosis not present

## 2021-02-03 DIAGNOSIS — M542 Cervicalgia: Secondary | ICD-10-CM | POA: Diagnosis not present

## 2021-02-03 DIAGNOSIS — M546 Pain in thoracic spine: Secondary | ICD-10-CM | POA: Diagnosis not present

## 2021-02-14 DIAGNOSIS — G5622 Lesion of ulnar nerve, left upper limb: Secondary | ICD-10-CM | POA: Diagnosis not present

## 2021-02-26 DIAGNOSIS — M546 Pain in thoracic spine: Secondary | ICD-10-CM | POA: Diagnosis not present

## 2021-02-26 DIAGNOSIS — M542 Cervicalgia: Secondary | ICD-10-CM | POA: Diagnosis not present

## 2021-03-07 DIAGNOSIS — M546 Pain in thoracic spine: Secondary | ICD-10-CM | POA: Diagnosis not present

## 2021-03-07 DIAGNOSIS — M542 Cervicalgia: Secondary | ICD-10-CM | POA: Diagnosis not present

## 2021-03-14 DIAGNOSIS — M546 Pain in thoracic spine: Secondary | ICD-10-CM | POA: Diagnosis not present

## 2021-03-14 DIAGNOSIS — M542 Cervicalgia: Secondary | ICD-10-CM | POA: Diagnosis not present

## 2021-03-18 DIAGNOSIS — M546 Pain in thoracic spine: Secondary | ICD-10-CM | POA: Diagnosis not present

## 2021-03-18 DIAGNOSIS — M542 Cervicalgia: Secondary | ICD-10-CM | POA: Diagnosis not present

## 2021-04-02 DIAGNOSIS — M546 Pain in thoracic spine: Secondary | ICD-10-CM | POA: Diagnosis not present

## 2021-04-02 DIAGNOSIS — M542 Cervicalgia: Secondary | ICD-10-CM | POA: Diagnosis not present

## 2021-04-23 DIAGNOSIS — M542 Cervicalgia: Secondary | ICD-10-CM | POA: Diagnosis not present

## 2021-04-23 DIAGNOSIS — M546 Pain in thoracic spine: Secondary | ICD-10-CM | POA: Diagnosis not present

## 2021-05-14 DIAGNOSIS — E039 Hypothyroidism, unspecified: Secondary | ICD-10-CM | POA: Diagnosis not present

## 2021-05-21 DIAGNOSIS — E039 Hypothyroidism, unspecified: Secondary | ICD-10-CM | POA: Diagnosis not present

## 2021-05-21 DIAGNOSIS — Z923 Personal history of irradiation: Secondary | ICD-10-CM | POA: Diagnosis not present

## 2021-11-25 DIAGNOSIS — Z124 Encounter for screening for malignant neoplasm of cervix: Secondary | ICD-10-CM | POA: Diagnosis not present

## 2021-11-25 DIAGNOSIS — Z13220 Encounter for screening for lipoid disorders: Secondary | ICD-10-CM | POA: Diagnosis not present

## 2021-11-25 DIAGNOSIS — Z01419 Encounter for gynecological examination (general) (routine) without abnormal findings: Secondary | ICD-10-CM | POA: Diagnosis not present

## 2021-11-25 DIAGNOSIS — Z6834 Body mass index (BMI) 34.0-34.9, adult: Secondary | ICD-10-CM | POA: Diagnosis not present

## 2021-11-25 DIAGNOSIS — Z1321 Encounter for screening for nutritional disorder: Secondary | ICD-10-CM | POA: Diagnosis not present

## 2021-11-25 DIAGNOSIS — Z13228 Encounter for screening for other metabolic disorders: Secondary | ICD-10-CM | POA: Diagnosis not present

## 2021-11-25 DIAGNOSIS — Z1151 Encounter for screening for human papillomavirus (HPV): Secondary | ICD-10-CM | POA: Diagnosis not present

## 2021-11-25 DIAGNOSIS — Z131 Encounter for screening for diabetes mellitus: Secondary | ICD-10-CM | POA: Diagnosis not present

## 2021-12-24 DIAGNOSIS — R319 Hematuria, unspecified: Secondary | ICD-10-CM | POA: Diagnosis not present

## 2022-01-10 IMAGING — MG MM DIGITAL DIAGNOSTIC UNILAT*L* W/ TOMO W/ CAD
6 series · 6 of 18 positions shown · non-contrast
Comparison: Previous exam(s).

CLINICAL DATA: Patient returns after baseline study for evaluation
of possible LEFT breast asymmetry.

EXAM:
DIGITAL DIAGNOSTIC UNILATERAL LEFT MAMMOGRAM WITH TOMOSYNTHESIS AND
CAD
TECHNIQUE: Left digital diagnostic mammography and breast tomosynthesis was
performed. The images were evaluated with computer-aided detection.

[L ML synth-2D]
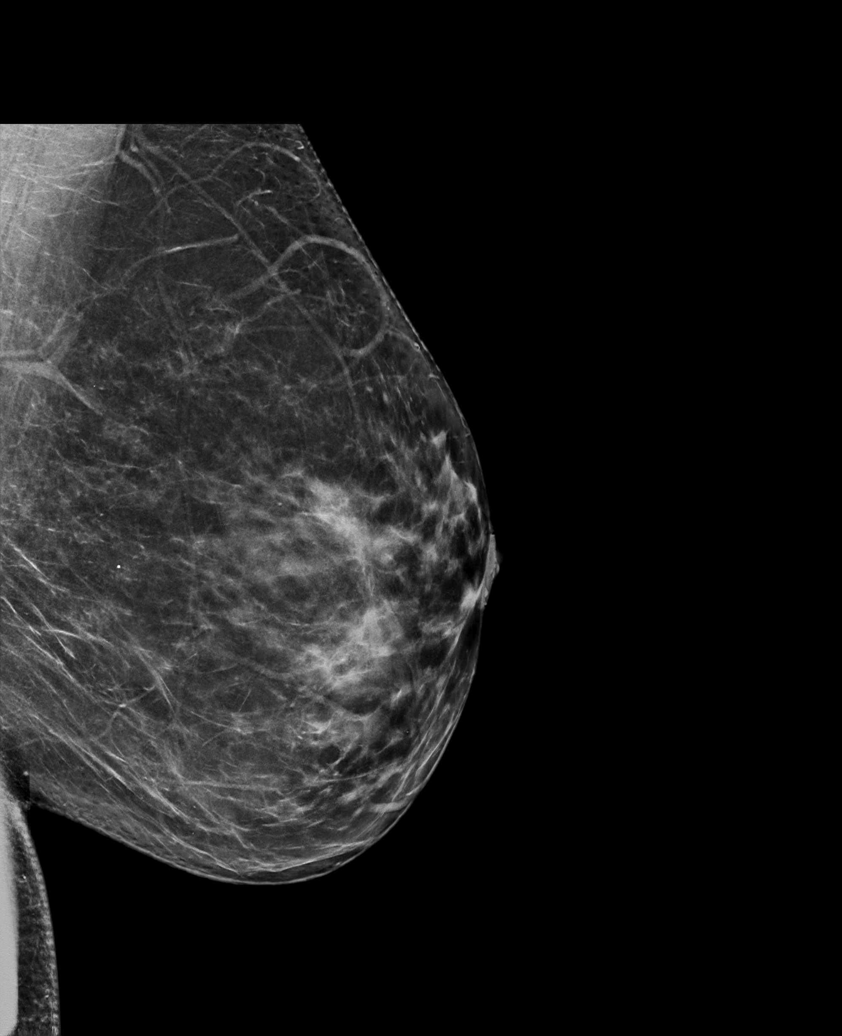

[L CC synth-2D (1 of 2)]
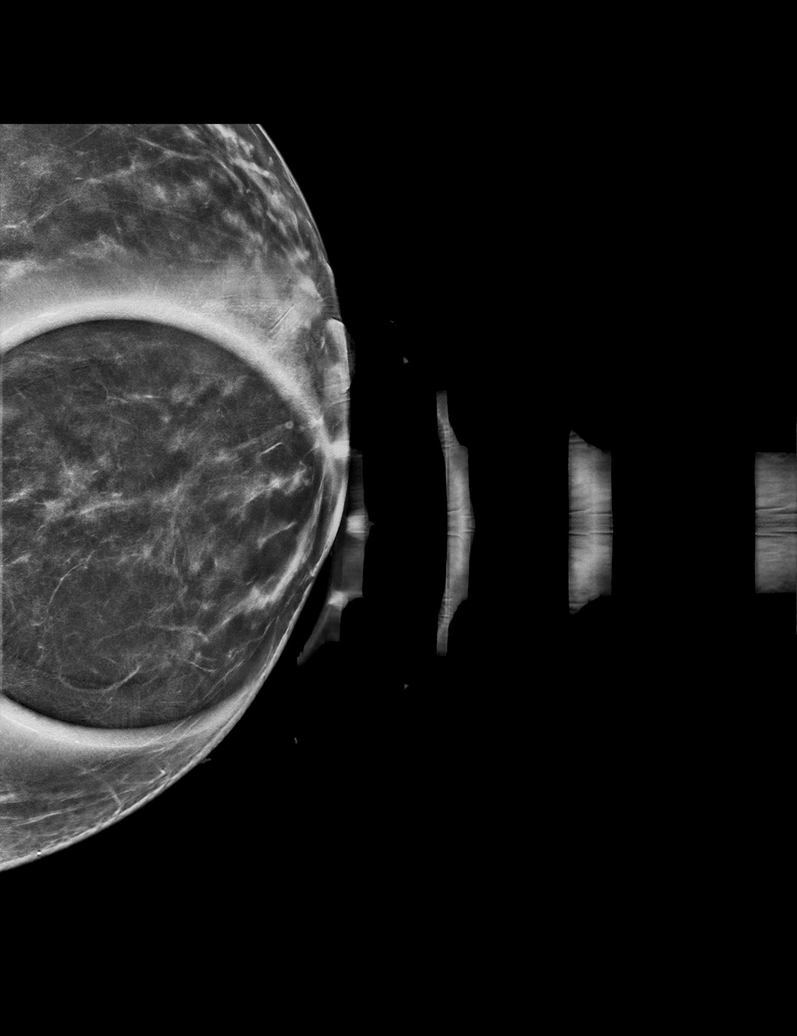

[L CC synth-2D (2 of 2)]
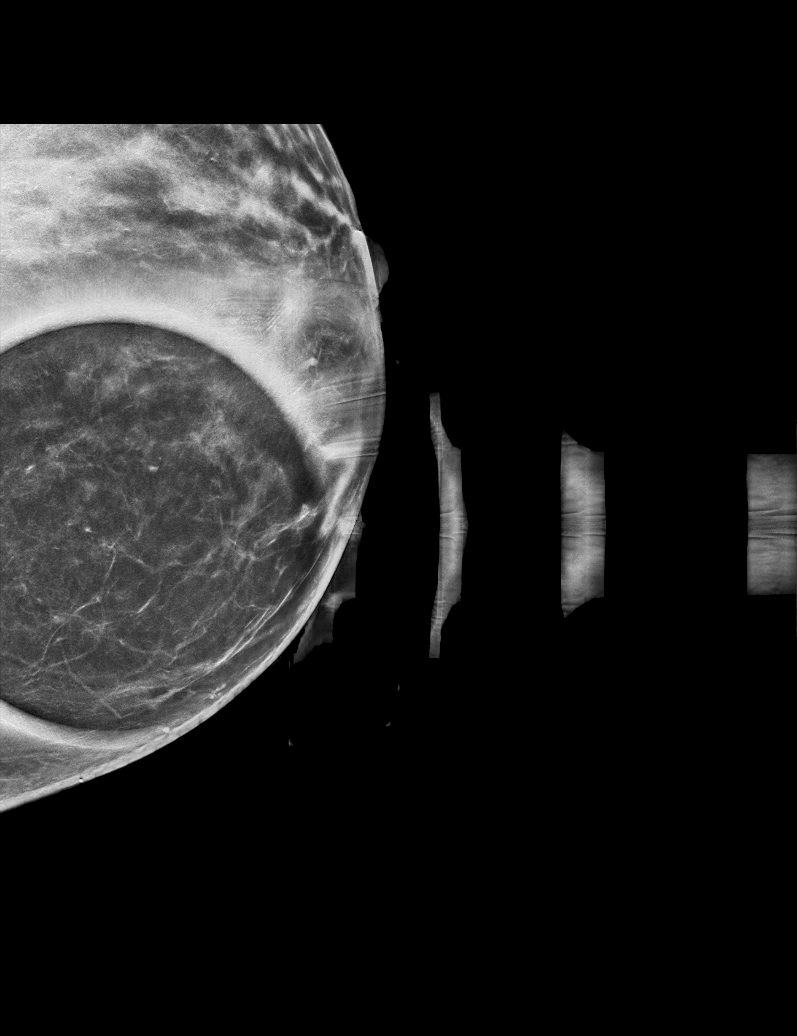

[L CC tomo (1 of 2) · tomo slice 33/65.0]
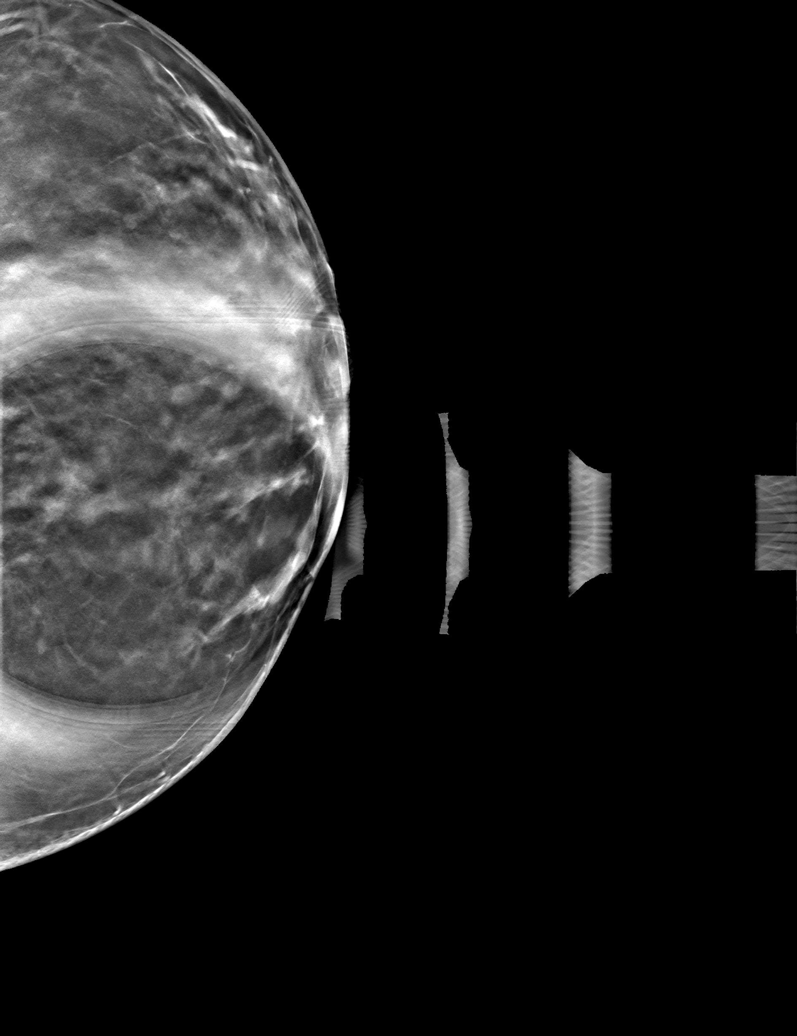

[L ML tomo · tomo slice 41/82.0]
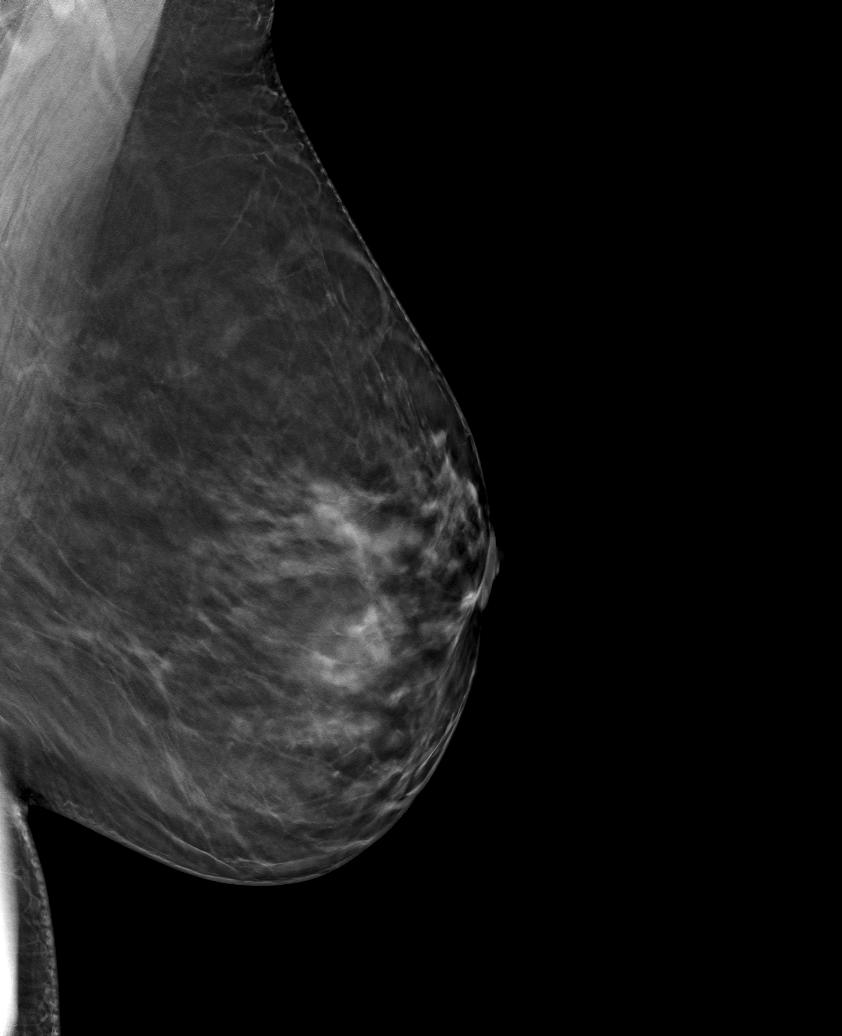

[L CC tomo (2 of 2) · tomo slice 32/63.0]
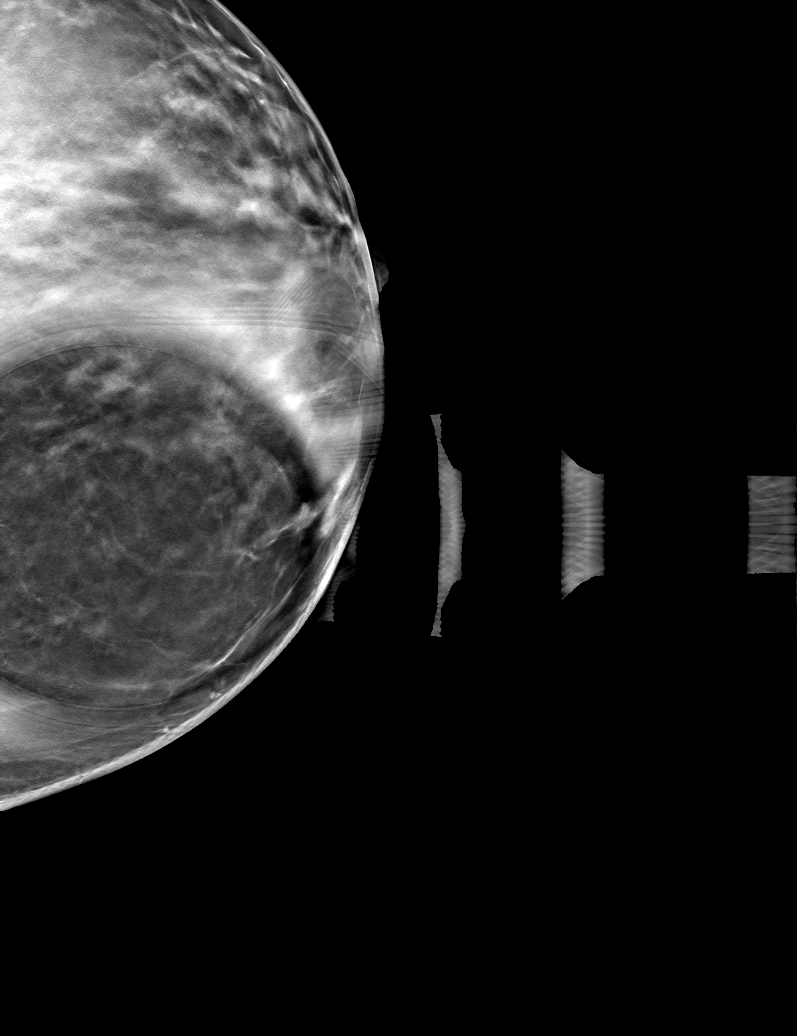

[6 of 18 positions shown; findings below may reference images not displayed]

ACR Breast Density Category c: The breast tissue is heterogeneously
dense, which may obscure small masses.
FINDINGS: Additional 2-D and 3-D images are performed. These views show no
persistent abnormality in the MEDIAL aspect of the LEFT breast. No
suspicious mass, distortion, or microcalcifications are identified
to suggest presence of malignancy.
IMPRESSION: No mammographic evidence for malignancy.

RECOMMENDATION:
Recommend screening mammogram at age 40 unless there are persistent
or intervening clinical concerns. (Code:JU-2-O37)

I have discussed the findings and recommendations with the patient.
If applicable, a reminder letter will be sent to the patient
regarding the next appointment.

BI-RADS CATEGORY  1: Negative.

## 2022-01-12 DIAGNOSIS — E559 Vitamin D deficiency, unspecified: Secondary | ICD-10-CM | POA: Diagnosis not present

## 2022-01-25 ENCOUNTER — Other Ambulatory Visit: Payer: Self-pay

## 2022-01-25 ENCOUNTER — Observation Stay (HOSPITAL_BASED_OUTPATIENT_CLINIC_OR_DEPARTMENT_OTHER)
Admission: EM | Admit: 2022-01-25 | Discharge: 2022-01-26 | Disposition: A | Discharge status: Home / Self Care | Payer: BC Managed Care – PPO | Attending: General Surgery | Admitting: General Surgery

## 2022-01-25 ENCOUNTER — Emergency Department (HOSPITAL_BASED_OUTPATIENT_CLINIC_OR_DEPARTMENT_OTHER): Payer: BC Managed Care – PPO

## 2022-01-25 ENCOUNTER — Encounter (HOSPITAL_BASED_OUTPATIENT_CLINIC_OR_DEPARTMENT_OTHER): Payer: Self-pay

## 2022-01-25 DIAGNOSIS — Z9049 Acquired absence of other specified parts of digestive tract: Secondary | ICD-10-CM

## 2022-01-25 DIAGNOSIS — Z79899 Other long term (current) drug therapy: Secondary | ICD-10-CM | POA: Insufficient documentation

## 2022-01-25 DIAGNOSIS — K8000 Calculus of gallbladder with acute cholecystitis without obstruction: Secondary | ICD-10-CM | POA: Diagnosis not present

## 2022-01-25 DIAGNOSIS — R1011 Right upper quadrant pain: Secondary | ICD-10-CM

## 2022-01-25 DIAGNOSIS — E039 Hypothyroidism, unspecified: Secondary | ICD-10-CM | POA: Insufficient documentation

## 2022-01-25 DIAGNOSIS — K810 Acute cholecystitis: Secondary | ICD-10-CM | POA: Diagnosis not present

## 2022-01-25 DIAGNOSIS — R109 Unspecified abdominal pain: Secondary | ICD-10-CM | POA: Diagnosis not present

## 2022-01-25 DIAGNOSIS — K8020 Calculus of gallbladder without cholecystitis without obstruction: Secondary | ICD-10-CM | POA: Diagnosis not present

## 2022-01-25 DIAGNOSIS — K819 Cholecystitis, unspecified: Secondary | ICD-10-CM

## 2022-01-25 HISTORY — DX: Hypothyroidism, unspecified: E03.9

## 2022-01-25 LAB — COMPREHENSIVE METABOLIC PANEL
ALT: 22 U/L (ref 0–44)
AST: 16 U/L (ref 15–41)
Albumin: 4.6 g/dL (ref 3.5–5.0)
Alkaline Phosphatase: 63 U/L (ref 38–126)
Anion gap: 11 (ref 5–15)
BUN: 11 mg/dL (ref 6–20)
CO2: 23 mmol/L (ref 22–32)
Calcium: 9.3 mg/dL (ref 8.9–10.3)
Chloride: 94 mmol/L — ABNORMAL LOW (ref 98–111)
Creatinine, Ser: 0.76 mg/dL (ref 0.44–1.00)
GFR, Estimated: >60 mL/min (ref >60)
Glucose, Bld: 144 mg/dL — ABNORMAL HIGH (ref 70–99)
Potassium: 3.5 mmol/L (ref 3.5–5.1)
Sodium: 128 mmol/L — ABNORMAL LOW (ref 135–145)
Total Bilirubin: 0.4 mg/dL (ref 0.3–1.2)
Total Protein: 7.7 g/dL (ref 6.5–8.1)

## 2022-01-25 LAB — LIPASE, BLOOD: Lipase: 15 U/L (ref 11–51)

## 2022-01-25 LAB — URINALYSIS, ROUTINE W REFLEX MICROSCOPIC
Bilirubin Urine: NEGATIVE
Glucose, UA: 100 mg/dL — AB
Leukocytes,Ua: NEGATIVE
Nitrite: NEGATIVE
Protein, ur: NEGATIVE mg/dL
RBC / HPF: >50 RBC/hpf — ABNORMAL HIGH (ref 0–5)
Specific Gravity, Urine: 1.021 (ref 1.005–1.030)
pH: 6.5 (ref 5.0–8.0)

## 2022-01-25 LAB — CBC
HCT: 38.3 % (ref 36.0–46.0)
Hemoglobin: 13.1 g/dL (ref 12.0–15.0)
MCH: 30 pg (ref 26.0–34.0)
MCHC: 34.2 g/dL (ref 30.0–36.0)
MCV: 87.8 fL (ref 80.0–100.0)
Platelets: 350 10*3/uL (ref 150–400)
RBC: 4.36 MIL/uL (ref 3.87–5.11)
RDW: 12 % (ref 11.5–15.5)
WBC: 11.8 10*3/uL — ABNORMAL HIGH (ref 4.0–10.5)
nRBC: 0 % (ref 0.0–0.2)

## 2022-01-25 LAB — PREGNANCY, URINE: Preg Test, Ur: NEGATIVE

## 2022-01-25 MED ORDER — LEVOTHYROXINE SODIUM 88 MCG PO TABS: 88.0000 ug | ORAL_TABLET | Freq: Every day | ORAL | Status: DC

## 2022-01-25 MED ORDER — HYDROMORPHONE HCL 1 MG/ML IJ SOLN
0.5000 mg | INTRAMUSCULAR | Status: DC | PRN
  Administered 2022-01-26: 0.5 mg via INTRAVENOUS
  Filled 2022-01-25: qty 0.5

## 2022-01-25 MED ORDER — AMITRIPTYLINE HCL 10 MG PO TABS
20.0000 mg | ORAL_TABLET | Freq: Every day | ORAL | Status: DC
  Administered 2022-01-25: 20 mg via ORAL
  Filled 2022-01-25 (×2): qty 1
  Filled 2022-01-25: qty 3

## 2022-01-25 MED ORDER — IOHEXOL 300 MG/ML  SOLN
100.0000 mL | Freq: Once | INTRAMUSCULAR | Status: AC | PRN
  Administered 2022-01-25: 80 mL via INTRAVENOUS

## 2022-01-25 MED ORDER — HYDROMORPHONE HCL 1 MG/ML IJ SOLN
1.0000 mg | Freq: Once | INTRAMUSCULAR | Status: AC
  Administered 2022-01-25: 1 mg via INTRAVENOUS
  Filled 2022-01-25: qty 1

## 2022-01-25 MED ORDER — HYDRALAZINE HCL 20 MG/ML IJ SOLN: 10.0000 mg | INTRAMUSCULAR | Status: DC | PRN

## 2022-01-25 MED ORDER — ADULT MULTIVITAMIN W/MINERALS CH
1.0000 | ORAL_TABLET | Freq: Every day | ORAL | Status: DC
  Administered 2022-01-25: 1 via ORAL
  Filled 2022-01-25: qty 1

## 2022-01-25 MED ORDER — IBUPROFEN 200 MG PO TABS
400.0000 mg | ORAL_TABLET | ORAL | Status: DC | PRN
  Administered 2022-01-25: 400 mg via ORAL
  Filled 2022-01-25: qty 1

## 2022-01-25 MED ORDER — VITAMIN D 25 MCG (1000 UNIT) PO TABS
1000.0000 [IU] | ORAL_TABLET | Freq: Every day | ORAL | Status: DC
  Administered 2022-01-25: 1000 [IU] via ORAL
  Filled 2022-01-25: qty 1

## 2022-01-25 MED ORDER — ACETAMINOPHEN 500 MG PO TABS
1000.0000 mg | ORAL_TABLET | Freq: Four times a day (QID) | ORAL | Status: DC
  Administered 2022-01-25 (×3): 1000 mg via ORAL
  Filled 2022-01-25 (×3): qty 2

## 2022-01-25 MED ORDER — SODIUM CHLORIDE 0.9 % IV BOLUS
1000.0000 mL | Freq: Once | INTRAVENOUS | Status: AC
  Administered 2022-01-25: 1000 mL via INTRAVENOUS

## 2022-01-25 MED ORDER — ONDANSETRON 4 MG PO TBDP: 4.0000 mg | ORAL_TABLET | Freq: Four times a day (QID) | ORAL | Status: DC | PRN

## 2022-01-25 MED ORDER — ENOXAPARIN SODIUM 40 MG/0.4ML IJ SOSY
40.0000 mg | PREFILLED_SYRINGE | INTRAMUSCULAR | Status: DC
  Administered 2022-01-25: 40 mg via SUBCUTANEOUS
  Filled 2022-01-25: qty 0.4

## 2022-01-25 MED ORDER — ONDANSETRON HCL 4 MG/2ML IJ SOLN: 4.0000 mg | Freq: Four times a day (QID) | INTRAMUSCULAR | Status: DC | PRN

## 2022-01-25 MED ORDER — ONDANSETRON HCL 4 MG/2ML IJ SOLN
4.0000 mg | Freq: Once | INTRAMUSCULAR | Status: AC
  Administered 2022-01-25: 4 mg via INTRAVENOUS
  Filled 2022-01-25: qty 2

## 2022-01-25 MED ORDER — SIMETHICONE 80 MG PO CHEW: 40.0000 mg | CHEWABLE_TABLET | Freq: Four times a day (QID) | ORAL | Status: DC | PRN

## 2022-01-25 MED ORDER — TRAMADOL HCL 50 MG PO TABS
50.0000 mg | ORAL_TABLET | Freq: Four times a day (QID) | ORAL | Status: DC | PRN
  Administered 2022-01-25: 50 mg via ORAL
  Filled 2022-01-25: qty 1

## 2022-01-25 MED ORDER — LACTATED RINGERS IV SOLN: INTRAVENOUS | Status: DC

## 2022-01-25 MED ORDER — FLUTICASONE PROPIONATE 50 MCG/ACT NA SUSP: 1.0000 | Freq: Every day | NASAL | Status: DC | PRN

## 2022-01-25 MED ORDER — SODIUM CHLORIDE 0.9 % IV SOLN
2.0000 g | INTRAVENOUS | Status: DC
  Administered 2022-01-25: 2 g via INTRAVENOUS
  Filled 2022-01-25: qty 20

## 2022-01-25 MED ORDER — MAGNESIUM OXIDE -MG SUPPLEMENT 400 (240 MG) MG PO TABS
400.0000 mg | ORAL_TABLET | Freq: Every day | ORAL | Status: DC
  Administered 2022-01-25: 400 mg via ORAL
  Filled 2022-01-25: qty 1

## 2022-01-25 NOTE — ED Notes (Signed)
[facility] at bedside 

## 2022-01-25 NOTE — ED Provider Notes (Signed)
  Physical Exam  BP (!) 140/91   Pulse 63   Temp 97.6 F (36.4 C) (Oral)   Resp 18   Ht 5\' 6"  (1.676 m)   Wt 97.5 kg   SpO2 92%   BMI 34.70 kg/m   Physical Exam  Procedures  Procedures  ED Course / MDM    Medical Decision Making Amount and/or Complexity of Data Reviewed Labs: ordered. Radiology: ordered.  Risk Prescription drug management.   41F, presenting with RUQ pain, n/v, got CT abdomen, some inflammation at fundus of gallbladder, no pericholecystic fluid. Some evidence of colitis, plan for to further evaluation, labs normal. [place] and reassess pain.  On repeat assessment, the patient continued to endorse pain in the right upper quadrant.  Her ultrasound was concerning for acute cholecystitis.  IV Dilaudid reordered for pain control.  General surgery consulted.  Notified by Dr. [place] that the patient can go to preop at [facility] for cholecystectomy.  [facility] notified and the patient was updated on the plan of care.         [facility], MD 01/25/22 [phone]

## 2022-01-25 NOTE — ED Provider Notes (Signed)
[facility]  CSN: [id number] Arrival date & time: 01/25/22 [id number]  Chief Complaint(s) Abdominal Pain  HPI Kelsey Gullyiffany L Dodson is a 41 y.o. female with a past medical history listed below including gallstones who presents to the emergency department for several hours of right upper quadrant abdominal pain that began 1 to 2 hours after dinner this past evening.  Pain has been constant and gradually worsened since onset with associated nausea and nonbloody nonbilious emesis.  No diarrhea.  No urinary symptoms.  Pain is gradually worsened since onset worse with movement and palpation.  No alleviating factors.  No coughing or congestion.  No chest pain or shortness of breath.  No other physical complaints.   Abdominal Pain   Past Medical History Past Medical History:  Diagnosis Date   Abnormal Pap smear    Hypothyroidism    There are no problems to display for this patient.  Home Medication(s) Prior to Admission medications   Medication Sig Start Date End Date Taking? Authorizing Provider  Prenatal Vit-Fe Fumarate-FA (PRENATAL MULTIVITAMIN) TABS Take 1 tablet by mouth daily.    [provider]  propylthiouracil (PTU) 50 MG tablet Take 50 mg by mouth 2 (two) times daily.    [provider]                                                                                                                                    Allergies Levofloxacin and Macrobid [nitrofurantoin]  Review of Systems Review of Systems  Gastrointestinal:  Positive for abdominal pain.   As noted in HPI  Physical Exam Vital Signs  I have reviewed the triage vital signs BP (!) 148/98   Pulse 70   Temp 97.6 F (36.4 C) (Oral)   Resp 18   Ht 5\' 6"  (1.676 m)   Wt 97.5 kg   SpO2 97%   BMI 34.70 kg/m   Physical Exam Vitals reviewed.  Constitutional:      General: She is not in acute distress.    Appearance: She is well-developed. She is not  diaphoretic.  HENT:     Head: Normocephalic and atraumatic.     Right Ear: External ear normal.     Left Ear: External ear normal.     Nose: Nose normal.  Eyes:     General: No scleral icterus.    Conjunctiva/sclera: Conjunctivae normal.  Neck:     Trachea: Phonation normal.  Cardiovascular:     Rate and Rhythm: Normal rate and regular rhythm.  Pulmonary:     Effort: Pulmonary effort is normal. No respiratory distress.     Breath sounds: No stridor.  Abdominal:     General: There is no distension.     Tenderness: There is abdominal tenderness in the right upper quadrant, epigastric area and periumbilical area. Negative signs include Murphy's sign.  Musculoskeletal:        General: Normal  range of motion.     Cervical back: Normal range of motion.  Neurological:     Mental Status: She is alert and oriented to person, place, and time.  Psychiatric:        Behavior: Behavior normal.     ED Results and Treatments Labs (all labs ordered are listed, but only abnormal results are displayed) Labs Reviewed  COMPREHENSIVE METABOLIC PANEL - Abnormal; Notable for the following components:      Result Value   Sodium 128 (*)    Chloride 94 (*)    Glucose, Bld 144 (*)    All other components within normal limits  CBC - Abnormal; Notable for the following components:   WBC 11.8 (*)    All other components within normal limits  URINALYSIS, ROUTINE W REFLEX MICROSCOPIC - Abnormal; Notable for the following components:   Glucose, UA 100 (*)    Hgb urine dipstick LARGE (*)    Ketones, ur TRACE (*)    RBC / HPF >50 (*)    Bacteria, UA RARE (*)    All other components within normal limits  LIPASE, BLOOD  PREGNANCY, URINE                                                                                                                         EKG  EKG Interpretation  Date/Time:    Ventricular Rate:    PR Interval:    QRS Duration:   QT Interval:    QTC Calculation:   R Axis:      Text Interpretation:         Radiology CT ABDOMEN PELVIS W CONTRAST  Result Date: 01/25/2022 CLINICAL DATA:  Epigastric pain. EXAM: CT ABDOMEN AND PELVIS WITH CONTRAST TECHNIQUE: Multidetector CT imaging of the abdomen and pelvis was performed using the standard protocol following bolus administration of intravenous contrast. RADIATION DOSE REDUCTION: This exam was performed according to the departmental dose-optimization program which includes automated exposure control, adjustment of the mA and/or kV according to patient size and/or use of iterative reconstruction technique. CONTRAST:  89mL OMNIPAQUE IOHEXOL 300 MG/ML  SOLN COMPARISON:  04/04/2018 FINDINGS: Lower chest: No pleural fluid or airspace disease. Hepatobiliary: No suspicious liver lesion. Multiple stones identified in the gallbladder which measure up to 1.1 cm. There is focal wall thickening involving the fundus of the gallbladder which measures 6 mm. No surrounding inflammatory fat stranding. CBD measures 7 mm. No CT visible stones within the common bile duct. Pancreas: Unremarkable. No pancreatic ductal dilatation or surrounding inflammatory changes. Spleen: Normal in size without focal abnormality. Adrenals/Urinary Tract: Normal adrenal glands. No nephrolithiasis, hydronephrosis or mass. Urinary bladder appears normal. Stomach/Bowel: Stomach appears normal. No small bowel wall thickening, inflammation, or distension. The appendix is visualized and appears normal. Partially decompressed colon with mild wall thickening involving the ascending colon and transverse colon. No significant surrounding inflammatory fat stranding. No signs of pneumatosis. Vascular/Lymphatic: No significant vascular findings are present. No enlarged abdominal or pelvic  lymph nodes. Reproductive: The uterus appears normal containing an IUD. Collapsing, corpus luteal cyst noted within the left ovary measuring 1.7 cm, image 62/2. Normal right ovary. Other: There is a  small amount of fluid within the lower abdominal mesentery and cul-de-sac, image 54/2 an image 65/2. No focal fluid collections identified. No signs of pneumoperitoneum. Musculoskeletal: No acute or significant osseous findings. IMPRESSION: 1. The colon appears largely decompressed with mild wall thickening involving the ascending colon and transverse colon without significant surrounding inflammatory fat stranding. Correlate for any clinical signs or symptoms of colitis. 2. Collapsing, corpus luteal cyst noted within the left ovary. 3. Small amount of fluid within the lower abdominal mesentery and cul-de-sac, nonspecific. In the setting of colitis this may be reactive. Alternatively, this may also be seen with ruptured ovarian cyst. 4. Gallstones with nonspecific wall thickening involving the gallbladder fundus. If there are clinical signs of acute cholecystitis consider further investigation with right upper quadrant sonogram. Electronically Signed   By: Signa Kell M.D.   On: 01/25/2022 05:28    Medications Ordered in ED Medications  HYDROmorphone (DILAUDID) injection 1 mg (1 mg Intravenous Given 01/25/22 0402)  ondansetron (ZOFRAN) injection 4 mg (4 mg Intravenous Given 01/25/22 0402)  sodium chloride 0.9 % bolus 1,000 mL (1,000 mLs Intravenous New Bag/Given 01/25/22 0402)  iohexol (OMNIPAQUE) 300 MG/ML solution 100 mL (80 mLs Intravenous Contrast Given 01/25/22 0430)                                                                                                                                     Procedures Procedures  (including critical care time)  Medical Decision Making / ED Course   Medical Decision Making Amount and/or Complexity of Data Reviewed Labs: ordered. Decision-making details documented in ED Course. Radiology: ordered and independent interpretation performed. Decision-making details documented in ED Course.  Risk Prescription drug management. Decision regarding  hospitalization.    Patient with right upper quadrant and upper abdominal pain. Differential includes biliary disease including acute cholecystitis, pancreatitis.  We will also assess for other intra-abdominal inflammatory/infectious process such as appendicitis or colitis.  Possible gastritis. We will check for UTI/pyelonephritis. We will also need to rule out pregnancy related process such as ectopic pregnancy.  Though this is less likely.  CBC with leukocytosis.  No anemia. Metabolic panel with hyponatremia, hyperglycemia without DKA.  No renal sufficiency.  No evidence of bili obstruction or pancreatitis. UA notable for hematuria but no evidence of urinary tract infection. UPT negative.  CT scan notable for gallstones and nonspecific fundus wall thickening.  Patient also has evidence of ascending and transverse colon wall thickening.  Patient provided with IV pain medicine, IV fluids and antiemetics.  We will need to obtain a right upper quadrant ultrasound to rule out acute cholecystitis.  This will be performed later on this morning at 8 AM once ultrasound arrives.  I feel this would be  faster than sending patient to [facility] for the ultrasound given the current time of 0545am.  Patient care turned over to oncoming provider. Patient case and results discussed in detail; please see their note for further ED managment.          Final Clinical Impression(s) / ED Diagnoses Final diagnoses:  RUQ pain           This chart was dictated using voice recognition software.  Despite best efforts to proofread,  errors can occur which can change the documentation meaning.    Nira Conn, MD 01/25/22 [phone]

## 2022-01-25 NOTE — H&P (Signed)
CC: RUQ pain  HPI: Kelsey Dodson is an 41 y.o. female with hx of hypothyroidism who [facility] earlier today with right upper quadrant pain of 1 day duration.  Began approximately 1 to 2 hours after eating dinner last night.  Never had this kind of pain before.  Some associated nausea as well as emesis.  Denies any issues with constipation or diarrhea.  Pain does not radiate.  No aggravating/alleviating factors.  After undergoing workup she was subsequent transferred to our hospital for further evaluation and planning of care.  Her husband is at bedside.  Denies any prior abdominal surgical history.  Past Medical History:  Diagnosis Date   Abnormal Pap smear    Hypothyroidism     Past Surgical History:  Procedure Laterality Date   DILATION AND EVACUATION  06/18/2011   Procedure: DILATATION AND EVACUATION;  Surgeon: Juluis Mire, MD;  Location: [facility];  Service: Gynecology;  Laterality: N/A;   LEEP  2005    History reviewed. No pertinent family history.  Social:  reports that she has never smoked. She has never used smokeless tobacco. She reports that she does not drink alcohol and does not use drugs.  Allergies:  Allergies  Allergen Reactions   Diclofenac Shortness Of Breath and Other (See Comments)    Respiratory distress, chest pain    Levofloxacin Hives   Macrobid [Nitrofurantoin] Hives    Medications: I have reviewed the patient's current medications.  Results for orders placed or performed during the hospital encounter of 01/25/22 (from the past 48 hour(s))  Lipase, blood     Status: None   Collection Time: 01/25/22  3:46 AM  Result Value Ref Range   Lipase 15 11 - 51 U/L    Comment: Performed at [facility], [address] [place], [place] [address]  Comprehensive metabolic panel     Status: Abnormal   Collection Time: 01/25/22  3:46 AM  Result Value Ref Range   Sodium 128 (L) 135 - 145 mmol/L   Potassium 3.5 3.5 -  5.1 mmol/L   Chloride 94 (L) 98 - 111 mmol/L   CO2 23 22 - 32 mmol/L   Glucose, Bld 144 (H) 70 - 99 mg/dL    Comment: Glucose reference range applies only to samples taken after fasting for at least 8 hours.   BUN 11 6 - 20 mg/dL   Creatinine, Ser 6.04 0.44 - 1.00 mg/dL   Calcium 9.3 8.9 - 54.0 mg/dL   Total Protein 7.7 6.5 - 8.1 g/dL   Albumin 4.6 3.5 - 5.0 g/dL   AST 16 15 - 41 U/L   ALT 22 0 - 44 U/L   Alkaline Phosphatase 63 38 - 126 U/L   Total Bilirubin 0.4 0.3 - 1.2 mg/dL   GFR, Estimated >98 >11 mL/min    Comment: (NOTE) Calculated using the CKD-EPI Creatinine Equation (2021)    Anion gap 11 5 - 15    Comment: Performed at [facility], [address] [place], [place], [place] [address]  CBC     Status: Abnormal   Collection Time: 01/25/22  3:46 AM  Result Value Ref Range   WBC 11.8 (H) 4.0 - 10.5 K/uL   RBC 4.36 3.87 - 5.11 MIL/uL   Hemoglobin 13.1 12.0 - 15.0 g/dL   HCT 29.5 62.1 - 30.8 %   MCV 87.8 80.0 - 100.0 fL   MCH 30.0 26.0 - 34.0 pg   MCHC 34.2 30.0 -  36.0 g/dL   RDW 51.7 61.6 - 07.3 %   Platelets 350 150 - 400 K/uL   nRBC 0.0 0.0 - 0.2 %    Comment: Performed at [facility], [address], [place], [place] [address]  Urinalysis, Routine w reflex microscopic Urine, Clean Catch     Status: Abnormal   Collection Time: 01/25/22  3:46 AM  Result Value Ref Range   Color, Urine YELLOW YELLOW   APPearance CLEAR CLEAR   Specific Gravity, Urine 1.021 1.005 - 1.030   pH 6.5 5.0 - 8.0   Glucose, UA 100 (A) NEGATIVE mg/dL   Hgb urine dipstick LARGE (A) NEGATIVE   Bilirubin Urine NEGATIVE NEGATIVE   Ketones, ur TRACE (A) NEGATIVE mg/dL   Protein, ur NEGATIVE NEGATIVE mg/dL   Nitrite NEGATIVE NEGATIVE   Leukocytes,Ua NEGATIVE NEGATIVE   RBC / HPF >50 (H) 0 - 5 RBC/hpf   WBC, UA 0-5 0 - 5 WBC/hpf   Bacteria, UA RARE (A) NONE SEEN   Squamous Epithelial / LPF 0-5 0 - 5   Mucus PRESENT     Comment: Performed at [facility], [address], [place], [place] [address]  Pregnancy, urine     Status: None   Collection Time: 01/25/22  3:46 AM  Result Value Ref Range   Preg Test, Ur NEGATIVE NEGATIVE    Comment:        THE SENSITIVITY OF THIS METHODOLOGY IS >20 mIU/mL. Performed at [facility], [address], [place], [place] [address]     US Abdomen Limited RUQ (LIVER/GB)  Result Date: 01/25/2022 CLINICAL DATA:  41 year old female with RIGHT UPPER quadrant abdominal pain. EXAM: ULTRASOUND ABDOMEN LIMITED RIGHT UPPER QUADRANT COMPARISON:  01/25/2022 CT and prior studies FINDINGS: Gallbladder: Multiple gallstones are noted, the largest measuring 1.4 cm. There is a nonmobile gallstone at the gallbladder neck neck. There is mild gallbladder wall thickening and positive sonographic Murphy sign, compatible with acute cholecystitis. Common bile duct: Diameter: 2 mm. There is no evidence of intrahepatic or extrahepatic biliary dilatation. Liver: No focal lesion identified. Within normal limits in parenchymal echogenicity. Portal vein is patent on color Doppler imaging with normal direction of blood flow towards the liver. Other: None. IMPRESSION: 1. Cholelithiasis, with mild gallbladder wall thickening and positive sonographic Murphy sign, compatible with acute cholecystitis. Nonmobile gallstone at the gallbladder neck. 2. No biliary dilatation. 3. Normal liver. Electronically Signed   By: Harmon Pier M.D.   On: 01/25/2022 09:05   CT ABDOMEN PELVIS W CONTRAST  Result Date: 01/25/2022 CLINICAL DATA:  Epigastric pain. EXAM: CT ABDOMEN AND PELVIS WITH CONTRAST TECHNIQUE: Multidetector CT imaging of the abdomen and pelvis was performed using the standard protocol following bolus administration of intravenous contrast. RADIATION DOSE REDUCTION: This exam was performed according to the departmental dose-optimization program which includes automated exposure control, adjustment of the mA and/or kV  according to patient size and/or use of iterative reconstruction technique. CONTRAST:  69mL OMNIPAQUE IOHEXOL 300 MG/ML  SOLN COMPARISON:  04/04/2018 FINDINGS: Lower chest: No pleural fluid or airspace disease. Hepatobiliary: No suspicious liver lesion. Multiple stones identified in the gallbladder which measure up to 1.1 cm. There is focal wall thickening involving the fundus of the gallbladder which measures 6 mm. No surrounding inflammatory fat stranding. CBD measures 7 mm. No CT visible stones within the common bile duct. Pancreas: Unremarkable. No pancreatic ductal dilatation or surrounding inflammatory changes. Spleen: Normal in size without focal abnormality. Adrenals/Urinary Tract: Normal adrenal glands.  No nephrolithiasis, hydronephrosis or mass. Urinary bladder appears normal. Stomach/Bowel: Stomach appears normal. No small bowel wall thickening, inflammation, or distension. The appendix is visualized and appears normal. Partially decompressed colon with mild wall thickening involving the ascending colon and transverse colon. No significant surrounding inflammatory fat stranding. No signs of pneumatosis. Vascular/Lymphatic: No significant vascular findings are present. No enlarged abdominal or pelvic lymph nodes. Reproductive: The uterus appears normal containing an IUD. Collapsing, corpus luteal cyst noted within the left ovary measuring 1.7 cm, image 62/2. Normal right ovary. Other: There is a small amount of fluid within the lower abdominal mesentery and cul-de-sac, image 54/2 an image 65/2. No focal fluid collections identified. No signs of pneumoperitoneum. Musculoskeletal: No acute or significant osseous findings. IMPRESSION: 1. The colon appears largely decompressed with mild wall thickening involving the ascending colon and transverse colon without significant surrounding inflammatory fat stranding. Correlate for any clinical signs or symptoms of colitis. 2. Collapsing, corpus luteal cyst noted  within the left ovary. 3. Small amount of fluid within the lower abdominal mesentery and cul-de-sac, nonspecific. In the setting of colitis this may be reactive. Alternatively, this may also be seen with ruptured ovarian cyst. 4. Gallstones with nonspecific wall thickening involving the gallbladder fundus. If there are clinical signs of acute cholecystitis consider further investigation with right upper quadrant sonogram. Electronically Signed   By: Signa Kell M.D.   On: 01/25/2022 05:28    ROS - all of the below systems have been reviewed with the patient and positives are indicated with bold text General: chills, fever or night sweats Eyes: blurry vision or double vision ENT: epistaxis or sore throat Allergy/Immunology: itchy/watery eyes or nasal congestion Hematologic/Lymphatic: bleeding problems, blood clots or swollen lymph nodes Endocrine: temperature intolerance or unexpected weight changes Breast: new or changing breast lumps or nipple discharge Resp: cough, shortness of breath, or wheezing CV: chest pain or dyspnea on exertion GI: as per HPI GU: dysuria, trouble voiding, or hematuria MSK: joint pain or joint stiffness Neuro: TIA or stroke symptoms Derm: pruritus and skin lesion changes Psych: anxiety and depression  PE Blood pressure (!) 155/104, pulse 73, temperature 98.9 F (37.2 C), temperature source Oral, resp. rate 18, height  (1.676 m), weight 97.5 kg, SpO2 96 %, unknown if currently breastfeeding. Constitutional: NAD; conversant Eyes: Moist conjunctiva; anicteric Lungs: Normal respiratory effort CV: RRR GI: Abd soft, moderately ttp in RUQ; no rebound/guarding nor tenderness eslewhere. Nondistended. MSK: Normal range of motion of extremities Psychiatric: Appropriate affect  Results for orders placed or performed during the hospital encounter of 01/25/22 (from the past 48 hour(s))  Lipase, blood     Status: None   Collection Time: 01/25/22  3:46 AM  Result  Value Ref Range   Lipase 15 11 - 51 U/L    Comment: Performed at [facility], [address], [place], [place] [address]  Comprehensive metabolic panel     Status: Abnormal   Collection Time: 01/25/22  3:46 AM  Result Value Ref Range   Sodium 128 (L) 135 - 145 mmol/L   Potassium 3.5 3.5 - 5.1 mmol/L   Chloride 94 (L) 98 - 111 mmol/L   CO2 23 22 - 32 mmol/L   Glucose, Bld 144 (H) 70 - 99 mg/dL    Comment: Glucose reference range applies only to samples taken after fasting for at least 8 hours.   BUN 11 6 - 20 mg/dL   Creatinine, Ser 6.04 0.44 - 1.00 mg/dL   Calcium  9.3 8.9 - 10.3 mg/dL   Total Protein 7.7 6.5 - 8.1 g/dL   Albumin 4.6 3.5 - 5.0 g/dL   AST 16 15 - 41 U/L   ALT 22 0 - 44 U/L   Alkaline Phosphatase 63 38 - 126 U/L   Total Bilirubin 0.4 0.3 - 1.2 mg/dL   GFR, Estimated >17 >61 mL/min    Comment: (NOTE) Calculated using the CKD-EPI Creatinine Equation (2021)    Anion gap 11 5 - 15    Comment: Performed at [facility], [address] [place], [place] [address]  CBC     Status: Abnormal   Collection Time: 01/25/22  3:46 AM  Result Value Ref Range   WBC 11.8 (H) 4.0 - 10.5 K/uL   RBC 4.36 3.87 - 5.11 MIL/uL   Hemoglobin 13.1 12.0 - 15.0 g/dL   HCT 10.6 26.9 - 48.5 %   MCV 87.8 80.0 - 100.0 fL   MCH 30.0 26.0 - 34.0 pg   MCHC 34.2 30.0 - 36.0 g/dL   RDW 46.2 70.3 - 50.0 %   Platelets 350 150 - 400 K/uL   nRBC 0.0 0.0 - 0.2 %    Comment: Performed at [facility], [address] [place], [place] [address]  Urinalysis, Routine w reflex microscopic Urine, Clean Catch     Status: Abnormal   Collection Time: 01/25/22  3:46 AM  Result Value Ref Range   Color, Urine YELLOW YELLOW   APPearance CLEAR CLEAR   Specific Gravity, Urine 1.021 1.005 - 1.030   pH 6.5 5.0 - 8.0   Glucose, UA 100 (A) NEGATIVE mg/dL   Hgb urine dipstick LARGE (A) NEGATIVE   Bilirubin Urine NEGATIVE NEGATIVE   Ketones, ur TRACE (A)  NEGATIVE mg/dL   Protein, ur NEGATIVE NEGATIVE mg/dL   Nitrite NEGATIVE NEGATIVE   Leukocytes,Ua NEGATIVE NEGATIVE   RBC / HPF >50 (H) 0 - 5 RBC/hpf   WBC, UA 0-5 0 - 5 WBC/hpf   Bacteria, UA RARE (A) NONE SEEN   Squamous Epithelial / LPF 0-5 0 - 5   Mucus PRESENT     Comment: Performed at [facility], [address] [place], [place] [address]  Pregnancy, urine     Status: None   Collection Time: 01/25/22  3:46 AM  Result Value Ref Range   Preg Test, Ur NEGATIVE NEGATIVE    Comment:        THE SENSITIVITY OF THIS METHODOLOGY IS >20 mIU/mL. Performed at [facility], [address], [place], [place] [address]     US Abdomen Limited RUQ (LIVER/GB)  Result Date: 01/25/2022 CLINICAL DATA:  41 year old female with RIGHT UPPER quadrant abdominal pain. EXAM: ULTRASOUND ABDOMEN LIMITED RIGHT UPPER QUADRANT COMPARISON:  01/25/2022 CT and prior studies FINDINGS: Gallbladder: Multiple gallstones are noted, the largest measuring 1.4 cm. There is a nonmobile gallstone at the gallbladder neck neck. There is mild gallbladder wall thickening and positive sonographic Murphy sign, compatible with acute cholecystitis. Common bile duct: Diameter: 2 mm. There is no evidence of intrahepatic or extrahepatic biliary dilatation. Liver: No focal lesion identified. Within normal limits in parenchymal echogenicity. Portal vein is patent on color Doppler imaging with normal direction of blood flow towards the liver. Other: None. IMPRESSION: 1. Cholelithiasis, with mild gallbladder wall thickening and positive sonographic Murphy sign, compatible with acute cholecystitis. Nonmobile gallstone at the gallbladder neck. 2. No biliary dilatation. 3. Normal liver. Electronically Signed   By: Henrietta Hoover.D.  On: 01/25/2022 09:05   CT ABDOMEN PELVIS W CONTRAST  Result Date: 01/25/2022 CLINICAL DATA:  Epigastric pain. EXAM: CT ABDOMEN AND PELVIS WITH CONTRAST TECHNIQUE:  Multidetector CT imaging of the abdomen and pelvis was performed using the standard protocol following bolus administration of intravenous contrast. RADIATION DOSE REDUCTION: This exam was performed according to the departmental dose-optimization program which includes automated exposure control, adjustment of the mA and/or kV according to patient size and/or use of iterative reconstruction technique. CONTRAST:  80mL OMNIPAQUE IOHEXOL 300 MG/ML  SOLN COMPARISON:  04/04/2018 FINDINGS: Lower chest: No pleural fluid or airspace disease. Hepatobiliary: No suspicious liver lesion. Multiple stones identified in the gallbladder which measure up to 1.1 cm. There is focal wall thickening involving the fundus of the gallbladder which measures 6 mm. No surrounding inflammatory fat stranding. CBD measures 7 mm. No CT visible stones within the common bile duct. Pancreas: Unremarkable. No pancreatic ductal dilatation or surrounding inflammatory changes. Spleen: Normal in size without focal abnormality. Adrenals/Urinary Tract: Normal adrenal glands. No nephrolithiasis, hydronephrosis or mass. Urinary bladder appears normal. Stomach/Bowel: Stomach appears normal. No small bowel wall thickening, inflammation, or distension. The appendix is visualized and appears normal. Partially decompressed colon with mild wall thickening involving the ascending colon and transverse colon. No significant surrounding inflammatory fat stranding. No signs of pneumatosis. Vascular/Lymphatic: No significant vascular findings are present. No enlarged abdominal or pelvic lymph nodes. Reproductive: The uterus appears normal containing an IUD. Collapsing, corpus luteal cyst noted within the left ovary measuring 1.7 cm, image 62/2. Normal right ovary. Other: There is a small amount of fluid within the lower abdominal mesentery and cul-de-sac, image 54/2 an image 65/2. No focal fluid collections identified. No signs of pneumoperitoneum. Musculoskeletal: No  acute or significant osseous findings. IMPRESSION: 1. The colon appears largely decompressed with mild wall thickening involving the ascending colon and transverse colon without significant surrounding inflammatory fat stranding. Correlate for any clinical signs or symptoms of colitis. 2. Collapsing, corpus luteal cyst noted within the left ovary. 3. Small amount of fluid within the lower abdominal mesentery and cul-de-sac, nonspecific. In the setting of colitis this may be reactive. Alternatively, this may also be seen with ruptured ovarian cyst. 4. Gallstones with nonspecific wall thickening involving the gallbladder fundus. If there are clinical signs of acute cholecystitis consider further investigation with right upper quadrant sonogram. Electronically Signed   By: Signa Kellaylor  Stroud M.D.   On: 01/25/2022 05:28    A/P: Kelsey Gullyiffany L Dodson is an 41 y.o. female with acute cholecystitis  -The anatomy and physiology of the hepatobiliary system was discussed with the patient. The pathophysiology of gallbladder disease was discussed as well. -The options for treatment were discussed including ongoing observation which may result in subsequent gallbladder complications (infection, pancreatitis, choledocholithiasis, etc) and surgery - laparoscopic cholecystectomy possible cholangiogram intraoperatively  -The planned procedure, material risks (including, but not limited to, pain, bleeding, infection, scarring, need for blood transfusion, damage to surrounding structures- blood vessels/nerves/viscus/organs, damage to bile duct, bile leak, need for additional procedures, hernia, worsening of pre-existing medical conditions, pancreatitis, pneumonia, heart attack, stroke, death) benefits and alternatives to surgery were discussed at length. We have noted a good probability that the procedure would help improve her symptoms. The patient's questions were answered to her satisfaction, she voiced understanding and they elected  to proceed with surgery. Additionally, we discussed typical postoperative expectations and the recovery process.  -We discussed timing of surgery likely be tomorrow based on OR availability/other emergencies  -In the  meantime, will continue with IV antibiotics.  Liquids as tolerated but nothing to eat or drink after midnight tonight.  -We discussed that surgery would be carried out by my partner, Dr. Derrell Lolling, whom she would meet and discuss with prior as well.   I spent a total of 65 minutes in both face-to-face and non-face-to-face activities, excluding procedures performed, for this visit on the date of this encounter.  Marin Olp, MD [facility], [facility]

## 2022-01-25 NOTE — ED Notes (Signed)
Pt arrived to [facility] from [facility] transferred to have a cholecystectomy. Per report, pt was supposed to go straight to short stay but d/t a case being pulled in front of the pt, pt is waiting for OR in the ED. Per report, pt refused pain meds prior to transport.

## 2022-01-25 NOTE — ED Provider Notes (Signed)
Patient transferred to [facility] ED from [facility] for cholecystectomy with Dr. Marin Olp. I have assessed the patient. She is having ongoing RUQ abdominal pain without nausea or vomiting at this time. Will re-dose pain medication while waiting for general surgery to see patient. I have paged general surgery to inform them she has arrived.    Cristopher Peru, PA-C 01/25/22 1302    Mardene Sayer, MD 01/25/22 1525

## 2022-01-25 NOTE — ED Notes (Signed)
Pt ambulated to [facility] independently with steady gait.

## 2022-01-25 NOTE — ED Triage Notes (Signed)
Patient presents from home states she has been having abd pain, nausea for 8 hours. States she took antacids and pepcid without relief. Denies vomiting or diarrhea but has had 5 Bms since pain started. Patient states she has also been having lower back pain when she lays down.

## 2022-01-26 ENCOUNTER — Encounter (HOSPITAL_COMMUNITY)
Admission: EM | Disposition: A | Discharge status: Home / Self Care | Payer: Self-pay | Source: Home / Self Care | Attending: Emergency Medicine

## 2022-01-26 ENCOUNTER — Other Ambulatory Visit: Payer: Self-pay

## 2022-01-26 ENCOUNTER — Observation Stay (HOSPITAL_COMMUNITY): Payer: BC Managed Care – PPO | Admitting: Anesthesiology

## 2022-01-26 ENCOUNTER — Encounter (HOSPITAL_COMMUNITY): Payer: Self-pay

## 2022-01-26 DIAGNOSIS — K8000 Calculus of gallbladder with acute cholecystitis without obstruction: Secondary | ICD-10-CM | POA: Diagnosis not present

## 2022-01-26 DIAGNOSIS — K810 Acute cholecystitis: Secondary | ICD-10-CM | POA: Diagnosis not present

## 2022-01-26 DIAGNOSIS — K82A1 Gangrene of gallbladder in cholecystitis: Secondary | ICD-10-CM | POA: Diagnosis not present

## 2022-01-26 DIAGNOSIS — Z9049 Acquired absence of other specified parts of digestive tract: Secondary | ICD-10-CM

## 2022-01-26 HISTORY — PX: CHOLECYSTECTOMY: SHX55

## 2022-01-26 LAB — CBC
HCT: 36.4 % (ref 36.0–46.0)
Hemoglobin: 12.9 g/dL (ref 12.0–15.0)
MCH: 30.7 pg (ref 26.0–34.0)
MCHC: 35.4 g/dL (ref 30.0–36.0)
MCV: 86.7 fL (ref 80.0–100.0)
Platelets: 327 10*3/uL (ref 150–400)
RBC: 4.2 MIL/uL (ref 3.87–5.11)
RDW: 12.3 % (ref 11.5–15.5)
WBC: 16.6 10*3/uL — ABNORMAL HIGH (ref 4.0–10.5)
nRBC: 0 % (ref 0.0–0.2)

## 2022-01-26 LAB — COMPREHENSIVE METABOLIC PANEL
ALT: 22 U/L (ref 0–44)
AST: 18 U/L (ref 15–41)
Albumin: 3.4 g/dL — ABNORMAL LOW (ref 3.5–5.0)
Alkaline Phosphatase: 56 U/L (ref 38–126)
Anion gap: 10 (ref 5–15)
BUN: 7 mg/dL (ref 6–20)
CO2: 23 mmol/L (ref 22–32)
Calcium: 8.8 mg/dL — ABNORMAL LOW (ref 8.9–10.3)
Chloride: 100 mmol/L (ref 98–111)
Creatinine, Ser: 0.77 mg/dL (ref 0.44–1.00)
GFR, Estimated: >60 mL/min (ref >60)
Glucose, Bld: 124 mg/dL — ABNORMAL HIGH (ref 70–99)
Potassium: 3.6 mmol/L (ref 3.5–5.1)
Sodium: 133 mmol/L — ABNORMAL LOW (ref 135–145)
Total Bilirubin: 0.8 mg/dL (ref 0.3–1.2)
Total Protein: 6.7 g/dL (ref 6.5–8.1)

## 2022-01-26 LAB — SURGICAL PCR SCREEN
MRSA, PCR: NEGATIVE
Staphylococcus aureus: NEGATIVE

## 2022-01-26 LAB — HIV ANTIBODY (ROUTINE TESTING W REFLEX): HIV Screen 4th Generation wRfx: NONREACTIVE

## 2022-01-26 SURGERY — LAPAROSCOPIC CHOLECYSTECTOMY
Anesthesia: General | Site: Abdomen

## 2022-01-26 SURGERY — LAPAROSCOPIC CHOLECYSTECTOMY
Anesthesia: General

## 2022-01-26 MED ORDER — OXYCODONE HCL 5 MG PO TABS
5.0000 mg | ORAL_TABLET | ORAL | Status: DC | PRN
  Administered 2022-01-26 (×2): 10 mg via ORAL
  Filled 2022-01-26 (×2): qty 2

## 2022-01-26 MED ORDER — SUGAMMADEX SODIUM 200 MG/2ML IV SOLN
INTRAVENOUS | Status: DC | PRN
  Administered 2022-01-26: 200 mg via INTRAVENOUS

## 2022-01-26 MED ORDER — ONDANSETRON HCL 4 MG/2ML IJ SOLN: 4.0000 mg | Freq: Four times a day (QID) | INTRAMUSCULAR | Status: DC | PRN

## 2022-01-26 MED ORDER — OXYCODONE HCL 5 MG PO TABS: 5.0000 mg | ORAL_TABLET | Freq: Once | ORAL | Status: DC | PRN

## 2022-01-26 MED ORDER — BUPIVACAINE-EPINEPHRINE (PF) 0.25% -1:200000 IJ SOLN
INTRAMUSCULAR | Status: AC
  Filled 2022-01-26: qty 30

## 2022-01-26 MED ORDER — PROPOFOL 10 MG/ML IV BOLUS
INTRAVENOUS | Status: AC
  Filled 2022-01-26: qty 20

## 2022-01-26 MED ORDER — SODIUM CHLORIDE 0.9 % IR SOLN
Status: DC | PRN
  Administered 2022-01-26: 1000 mL

## 2022-01-26 MED ORDER — FENTANYL CITRATE (PF) 100 MCG/2ML IJ SOLN
25.0000 ug | INTRAMUSCULAR | Status: DC | PRN
  Administered 2022-01-26: 50 ug via INTRAVENOUS

## 2022-01-26 MED ORDER — BUPIVACAINE-EPINEPHRINE 0.25% -1:200000 IJ SOLN
INTRAMUSCULAR | Status: DC | PRN
  Administered 2022-01-26: 12 mL

## 2022-01-26 MED ORDER — CHLORHEXIDINE GLUCONATE 0.12 % MT SOLN
OROMUCOSAL | Status: AC
  Administered 2022-01-26: 15 mL via OROMUCOSAL
  Filled 2022-01-26: qty 15

## 2022-01-26 MED ORDER — ONDANSETRON HCL 4 MG/2ML IJ SOLN
INTRAMUSCULAR | Status: DC | PRN
  Administered 2022-01-26: 4 mg via INTRAVENOUS

## 2022-01-26 MED ORDER — ONDANSETRON HCL 4 MG/2ML IJ SOLN
INTRAMUSCULAR | Status: AC
  Filled 2022-01-26: qty 2

## 2022-01-26 MED ORDER — FENTANYL CITRATE (PF) 100 MCG/2ML IJ SOLN
INTRAMUSCULAR | Status: AC
  Filled 2022-01-26: qty 2

## 2022-01-26 MED ORDER — 0.9 % SODIUM CHLORIDE (POUR BTL) OPTIME
TOPICAL | Status: DC | PRN
  Administered 2022-01-26: 1000 mL

## 2022-01-26 MED ORDER — ROCURONIUM BROMIDE 10 MG/ML (PF) SYRINGE
PREFILLED_SYRINGE | INTRAVENOUS | Status: AC
  Filled 2022-01-26: qty 10

## 2022-01-26 MED ORDER — CHLORHEXIDINE GLUCONATE 0.12 % MT SOLN: 15.0000 mL | Freq: Once | OROMUCOSAL | Status: AC

## 2022-01-26 MED ORDER — MIDAZOLAM HCL 2 MG/2ML IJ SOLN
INTRAMUSCULAR | Status: DC | PRN
  Administered 2022-01-26: 2 mg via INTRAVENOUS

## 2022-01-26 MED ORDER — LIDOCAINE 2% (20 MG/ML) 5 ML SYRINGE
INTRAMUSCULAR | Status: DC | PRN
  Administered 2022-01-26: 60 mg via INTRAVENOUS

## 2022-01-26 MED ORDER — DEXAMETHASONE SODIUM PHOSPHATE 10 MG/ML IJ SOLN
INTRAMUSCULAR | Status: AC
  Filled 2022-01-26: qty 1

## 2022-01-26 MED ORDER — FENTANYL CITRATE (PF) 250 MCG/5ML IJ SOLN
INTRAMUSCULAR | Status: AC
  Filled 2022-01-26: qty 5

## 2022-01-26 MED ORDER — ORAL CARE MOUTH RINSE: 15.0000 mL | Freq: Once | OROMUCOSAL | Status: AC

## 2022-01-26 MED ORDER — DEXAMETHASONE SODIUM PHOSPHATE 10 MG/ML IJ SOLN
INTRAMUSCULAR | Status: DC | PRN
  Administered 2022-01-26: 8 mg via INTRAVENOUS

## 2022-01-26 MED ORDER — ACETAMINOPHEN 500 MG PO TABS: 1000.0000 mg | ORAL_TABLET | Freq: Four times a day (QID) | ORAL | Status: AC | PRN

## 2022-01-26 MED ORDER — OXYCODONE HCL 5 MG/5ML PO SOLN: 5.0000 mg | Freq: Once | ORAL | Status: DC | PRN

## 2022-01-26 MED ORDER — LIDOCAINE 2% (20 MG/ML) 5 ML SYRINGE
INTRAMUSCULAR | Status: AC
  Filled 2022-01-26: qty 5

## 2022-01-26 MED ORDER — FENTANYL CITRATE (PF) 250 MCG/5ML IJ SOLN
INTRAMUSCULAR | Status: DC | PRN
  Administered 2022-01-26 (×2): 50 ug via INTRAVENOUS
  Administered 2022-01-26: 100 ug via INTRAVENOUS
  Administered 2022-01-26: 50 ug via INTRAVENOUS

## 2022-01-26 MED ORDER — OXYCODONE HCL 5 MG PO TABS: 5.0000 mg | ORAL_TABLET | Freq: Four times a day (QID) | ORAL | 0 refills | Status: AC | PRN

## 2022-01-26 MED ORDER — LACTATED RINGERS IV SOLN: INTRAVENOUS | Status: DC

## 2022-01-26 MED ORDER — ROCURONIUM BROMIDE 10 MG/ML (PF) SYRINGE
PREFILLED_SYRINGE | INTRAVENOUS | Status: DC | PRN
  Administered 2022-01-26: 60 mg via INTRAVENOUS

## 2022-01-26 MED ORDER — LACTATED RINGERS IV SOLN: INTRAVENOUS | Status: DC | PRN

## 2022-01-26 MED ORDER — PROPOFOL 10 MG/ML IV BOLUS
INTRAVENOUS | Status: DC | PRN
  Administered 2022-01-26: 180 mg via INTRAVENOUS

## 2022-01-26 MED ORDER — MIDAZOLAM HCL 2 MG/2ML IJ SOLN
INTRAMUSCULAR | Status: AC
  Filled 2022-01-26: qty 2

## 2022-01-26 SURGICAL SUPPLY — 47 items
ADH SKN CLS APL DERMABOND .7 (GAUZE/BANDAGES/DRESSINGS) ×2
APL PRP STRL LF DISP 70% ISPRP (MISCELLANEOUS) ×1
BAG COUNTER SPONGE SURGICOUNT (BAG) ×2 IMPLANT
BAG SPEC RTRVL 10 TROC 200 (ENDOMECHANICALS) ×1
BAG SPNG CNTER NS LX DISP (BAG) ×1
CANISTER SUCT 3000ML PPV (MISCELLANEOUS) ×2 IMPLANT
CHLORAPREP W/TINT 26 (MISCELLANEOUS) ×2 IMPLANT
CLIP LIGATING HEMO O LOK GREEN (MISCELLANEOUS) ×2 IMPLANT
COVER SURGICAL LIGHT HANDLE (MISCELLANEOUS) ×2 IMPLANT
COVER TRANSDUCER ULTRASND (DRAPES) ×2 IMPLANT
DERMABOND ADVANCED .7 DNX12 (GAUZE/BANDAGES/DRESSINGS) ×2 IMPLANT
ELECT REM PT RETURN 9FT ADLT (ELECTROSURGICAL) ×1
ELECTRODE REM PT RTRN 9FT ADLT (ELECTROSURGICAL) ×2 IMPLANT
GLOVE BIO SURGEON STRL SZ7.5 (GLOVE) ×2 IMPLANT
GLOVE SURG SYN 7.5  E (GLOVE) ×1
GLOVE SURG SYN 7.5 E (GLOVE) ×1 IMPLANT
GLOVE SURG SYN 7.5 PF PI (GLOVE) ×2 IMPLANT
GOWN STRL REUS W/ TWL LRG LVL3 (GOWN DISPOSABLE) ×4 IMPLANT
GOWN STRL REUS W/ TWL XL LVL3 (GOWN DISPOSABLE) ×2 IMPLANT
GOWN STRL REUS W/TWL LRG LVL3 (GOWN DISPOSABLE) ×2
GOWN STRL REUS W/TWL XL LVL3 (GOWN DISPOSABLE) ×1
GRASPER SUT TROCAR 14GX15 (MISCELLANEOUS) ×2 IMPLANT
KIT BASIN OR (CUSTOM PROCEDURE TRAY) ×2 IMPLANT
KIT TURNOVER KIT B (KITS) ×2 IMPLANT
NDL INSUFFLATION 14GA 120MM (NEEDLE) ×2 IMPLANT
NEEDLE INSUFFLATION 14GA 120MM (NEEDLE) ×1 IMPLANT
NS IRRIG 1000ML POUR BTL (IV SOLUTION) ×2 IMPLANT
PAD ARMBOARD 7.5X6 YLW CONV (MISCELLANEOUS) ×2 IMPLANT
POUCH LAPAROSCOPIC INSTRUMENT (MISCELLANEOUS) ×2 IMPLANT
POUCH RETRIEVAL ECOSAC 10 (ENDOMECHANICALS) IMPLANT
POUCH RETRIEVAL ECOSAC 10MM (ENDOMECHANICALS) ×1
SCISSORS LAP 5X35 DISP (ENDOMECHANICALS) ×2 IMPLANT
SET IRRIG TUBING LAPAROSCOPIC (IRRIGATION / IRRIGATOR) ×2 IMPLANT
SET TUBE SMOKE EVAC HIGH FLOW (TUBING) ×2 IMPLANT
SLEEVE ENDOPATH XCEL 5M (ENDOMECHANICALS) ×2 IMPLANT
SLEEVE Z-THREAD 5X100MM (TROCAR) IMPLANT
SPECIMEN JAR SMALL (MISCELLANEOUS) ×2 IMPLANT
SUT MNCRL AB 4-0 PS2 18 (SUTURE) ×2 IMPLANT
SUT VICRYL 0 UR6 27IN ABS (SUTURE) IMPLANT
TOWEL GREEN STERILE (TOWEL DISPOSABLE) ×2 IMPLANT
TOWEL GREEN STERILE FF (TOWEL DISPOSABLE) ×2 IMPLANT
TRAY LAPAROSCOPIC MC (CUSTOM PROCEDURE TRAY) ×2 IMPLANT
TROCAR XCEL NON-BLD 11X100MML (ENDOMECHANICALS) ×2 IMPLANT
TROCAR Z-THREAD BLADED 11X100M (TROCAR) IMPLANT
TROCAR Z-THREAD OPTICAL 5X100M (TROCAR) ×2 IMPLANT
WARMER LAPAROSCOPE (MISCELLANEOUS) ×2 IMPLANT
WATER STERILE IRR 1000ML POUR (IV SOLUTION) ×2 IMPLANT

## 2022-01-26 NOTE — Progress Notes (Signed)
Pt prepared and left room for OT on stretcher accompanied by transport

## 2022-01-26 NOTE — Anesthesia Preprocedure Evaluation (Signed)
Anesthesia Evaluation  Patient identified by MRN, date of birth, ID band Patient awake    Reviewed: Allergy & Precautions, H&P , NPO status , Patient's Chart, lab work & pertinent test results  Airway Mallampati: II   Neck ROM: full    Dental   Pulmonary neg pulmonary ROS   breath sounds clear to auscultation       Cardiovascular negative cardio ROS  Rhythm:regular Rate:Normal     Neuro/Psych    GI/Hepatic   Endo/Other  Hypothyroidism    Renal/GU      Musculoskeletal   Abdominal   Peds  Hematology   Anesthesia Other Findings   Reproductive/Obstetrics                             Anesthesia Physical Anesthesia Plan  ASA: 2  Anesthesia Plan: General   Post-op Pain Management:    Induction: Intravenous  PONV Risk Score and Plan: 2 and Ondansetron, Dexamethasone, Midazolam and Treatment may vary due to age or medical condition  Airway Management Planned: Oral ETT  Additional Equipment:   Intra-op Plan:   Post-operative Plan: Extubation in OR  Informed Consent: I have reviewed the patients History and Physical, chart, labs and discussed the procedure including the risks, benefits and alternatives for the proposed anesthesia with the patient or authorized representative who has indicated his/her understanding and acceptance.     Dental advisory given  Plan Discussed with: CRNA, Anesthesiologist and Surgeon  Anesthesia Plan Comments:        Anesthesia Quick Evaluation

## 2022-01-26 NOTE — Transfer of Care (Signed)
Immediate Anesthesia Transfer of Care Note  Patient: Kelsey Dodson  Procedure(s) Performed: LAPAROSCOPIC CHOLECYSTECTOMY (Abdomen)  Patient Location: PACU  Anesthesia Type:General  Level of Consciousness: awake and drowsy  Airway & Oxygen Therapy: Patient Spontanous Breathing and Patient connected to nasal cannula oxygen  Post-op Assessment: Report given to RN, Post -op Vital signs reviewed and stable, and Patient moving all extremities X 4  Post vital signs: Reviewed and stable  Last Vitals:  Vitals Value Taken Time  BP 135/85 01/26/22 0920  Temp 36.6 C 01/26/22 0920  Pulse 77 01/26/22 0923  Resp 16 01/26/22 0923  SpO2 97 % 01/26/22 0923  Vitals shown include unvalidated device data.  Last Pain:  Vitals:   01/26/22 0920  TempSrc:   PainSc: 0-No pain      Patients Stated Pain Goal: 3 (01/26/22 0200)  Complications: No notable events documented.

## 2022-01-26 NOTE — Discharge Instructions (Signed)
[facility] ° °Please arrive at least 30 min before your appointment to complete your check in paperwork.  If you are unable to arrive 30 min prior to your appointment time we may have to cancel or reschedule you. °LAPAROSCOPIC SURGERY: POST OP INSTRUCTIONS °Always review your discharge instruction sheet given to you by the facility where your surgery was performed. °IF YOU HAVE DISABILITY OR FAMILY LEAVE FORMS, YOU MUST BRING THEM TO THE OFFICE FOR PROCESSING.   °DO NOT GIVE THEM TO YOUR DOCTOR. ° °PAIN CONTROL ° °First take acetaminophen (Tylenol) AND/or ibuprofen (Advil) to control your pain after surgery.  Follow directions on package.  Taking acetaminophen (Tylenol) and/or ibuprofen (Advil) regularly after surgery will help to control your pain and lower the amount of prescription pain medication you may need.  You should not take more than 4,000 mg (4 grams) of acetaminophen (Tylenol) in 24 hours.  You should not take ibuprofen (Advil), aleve, motrin, naprosyn or other NSAIDS if you have a history of stomach ulcers or chronic kidney disease.  °A prescription for pain medication may be given to you upon discharge.  Take your pain medication as prescribed, if you still have uncontrolled pain after taking acetaminophen (Tylenol) or ibuprofen (Advil). °Use ice packs to help control pain. °If you need a refill on your pain medication, please contact your pharmacy.  They will contact our office to request authorization. Prescriptions will not be filled after 5pm or on week-ends. ° °HOME MEDICATIONS °Take your usually prescribed medications unless otherwise directed. ° °DIET °You should follow a light diet the first few days after arrival home.  Be sure to include lots of fluids daily. Avoid fatty, fried foods.  ° °CONSTIPATION °It is common to experience some constipation after surgery and if you are taking pain medication.  Increasing fluid intake and taking a stool softener (such as Colace)  will usually help or prevent this problem from occurring.  A mild laxative (Milk of Magnesia or Miralax) should be taken according to package instructions if there are no bowel movements after 48 hours. ° °WOUND/INCISION CARE °Most patients will experience some swelling and bruising in the area of the incisions.  Ice packs will help.  Swelling and bruising can take several days to resolve.  °Unless discharge instructions indicate otherwise, follow guidelines below  °STERI-STRIPS - you may remove your outer bandages 48 hours after surgery, and you may shower at that time.  You have steri-strips (small skin tapes) in place directly over the incision.  These strips should be left on the skin for 7-10 days.   °DERMABOND/SKIN GLUE - you may shower in 24 hours.  The glue will flake off over the next 2-3 weeks. °Any sutures or staples will be removed at the office during your follow-up visit. ° °ACTIVITIES °You may resume regular (light) daily activities beginning the next day--such as daily self-care, walking, climbing stairs--gradually increasing activities as tolerated.  You may have sexual intercourse when it is comfortable.  Refrain from any heavy lifting or straining until approved by your doctor. °You may drive when you are no longer taking prescription pain medication, you can comfortably wear a seatbelt, and you can safely maneuver your car and apply brakes. ° °FOLLOW-UP °You should see your doctor in the office for a follow-up appointment approximately 2-3 weeks after your surgery.  You should have been given your post-op/follow-up appointment when your surgery was scheduled.  If you did not receive a post-op/follow-up appointment, make sure   that you call for this appointment within a day or two after you arrive home to insure a convenient appointment time. ° ° °WHEN TO CALL YOUR DOCTOR: °Fever over 101.0 °Inability to urinate °Continued bleeding from incision. °Increased pain, redness, or drainage from the  incision. °Increasing abdominal pain ° °The clinic staff is available to answer your questions during regular business hours.  Please don’t hesitate to call and ask to speak to one of the nurses for clinical concerns.  If you have a medical emergency, go to the nearest emergency room or call 911.  A surgeon from [facility] is always on call at the hospital. °[address], [address], [place], [place]  [address] ? [address] [address], [place], [place]   [address] °[phone] ? [phone] ? FAX [phone] ° ° ° ° °Managing Your Pain After Surgery Without Opioids ° ° ° °Thank you for participating in our program to help patients manage their pain after surgery without opioids. This is part of our effort to provide you with the best care possible, without exposing you or your family to the risk that opioids pose. ° °What pain can I expect after surgery? °You can expect to have some pain after surgery. This is normal. The pain is typically worse the day after surgery, and quickly begins to get better. °Many studies have found that many patients are able to manage their pain after surgery with Over-the-Counter (OTC) medications such as Tylenol and Motrin. If you have a condition that does not allow you to take Tylenol or Motrin, notify your surgical team. ° °How will I manage my pain? °The best strategy for controlling your pain after surgery is around the clock pain control with Tylenol (acetaminophen) and Motrin (ibuprofen or Advil). Alternating these medications with each other allows you to maximize your pain control. In addition to Tylenol and Motrin, you can use heating pads or ice packs on your incisions to help reduce your pain. ° °How will I alternate your regular strength over-the-counter pain medication? °You will take a dose of pain medication every three hours. °Start by taking 650 mg of Tylenol (2 pills of 325 mg) °3 hours later take 600 mg of Motrin (3 pills of 200 mg) °3 hours after  taking the Motrin take 650 mg of Tylenol °3 hours after that take 600 mg of Motrin. ° ° °- 1 - ° °See example - if your first dose of Tylenol is at 12:00 PM ° ° °12:00 PM Tylenol 650 mg (2 pills of 325 mg)  °3:00 PM Motrin 600 mg (3 pills of 200 mg)  °6:00 PM Tylenol 650 mg (2 pills of 325 mg)  °9:00 PM Motrin 600 mg (3 pills of 200 mg)  °Continue alternating every 3 hours  ° °We recommend that you follow this schedule around-the-clock for at least 3 days after surgery, or until you feel that it is no longer needed. Use the table on the last page of this handout to keep track of the medications you are taking. °Important: °Do not take more than 3000mg of Tylenol or 3200mg of Motrin in a 24-hour period. °Do not take ibuprofen/Motrin if you have a history of bleeding stomach ulcers, severe kidney disease, &/or actively taking a blood thinner ° °What if I still have pain? °If you have pain that is not controlled with the over-the-counter pain medications (Tylenol and Motrin or Advil) you might have what we call “breakthrough” pain. You will receive a prescription   for a small amount of an opioid pain medication such as Oxycodone, Tramadol, or Tylenol with Codeine. Use these opioid pills in the first 24 hours after surgery if you have breakthrough pain. Do not take more than 1 pill every 4-6 hours. ° °If you still have uncontrolled pain after using all opioid pills, don't hesitate to call our staff using the number provided. We will help make sure you are managing your pain in the best way possible, and if necessary, we can provide a prescription for additional pain medication. ° ° °Day 1   ° °Time  °Name of Medication Number of pills taken  °Amount of Acetaminophen  °Pain Level  ° °Comments  °AM PM       °AM PM       °AM PM       °AM PM       °AM PM       °AM PM       °AM PM       °AM PM       °Total Daily amount of Acetaminophen °Do not take more than  3,000 mg per day    ° ° °Day 2   ° °Time  °Name of Medication  Number of pills °taken  °Amount of Acetaminophen  °Pain Level  ° °Comments  °AM PM       °AM PM       °AM PM       °AM PM       °AM PM       °AM PM       °AM PM       °AM PM       °Total Daily amount of Acetaminophen °Do not take more than  3,000 mg per day    ° ° °Day 3   ° °Time  °Name of Medication Number of pills taken  °Amount of Acetaminophen  °Pain Level  ° °Comments  °AM PM       °AM PM       °AM PM       °AM PM       ° ° ° °AM PM       °AM PM       °AM PM       °AM PM       °Total Daily amount of Acetaminophen °Do not take more than  3,000 mg per day    ° ° °Day 4   ° °Time  °Name of Medication Number of pills taken  °Amount of Acetaminophen  °Pain Level  ° °Comments  °AM PM       °AM PM       °AM PM       °AM PM       °AM PM       °AM PM       °AM PM       °AM PM       °Total Daily amount of Acetaminophen °Do not take more than  3,000 mg per day    ° ° °Day 5   ° °Time  °Name of Medication Number °of pills taken  °Amount of Acetaminophen  °Pain Level  ° °Comments  °AM PM       °AM PM       °AM PM       °AM PM       °AM PM       °AM   PM       °AM PM       °AM PM       °Total Daily amount of Acetaminophen °Do not take more than  3,000 mg per day    ° ° ° °Day 6   ° °Time  °Name of Medication Number of pills °taken  °Amount of Acetaminophen  °Pain Level  °Comments  °AM PM       °AM PM       °AM PM       °AM PM       °AM PM       °AM PM       °AM PM       °AM PM       °Total Daily amount of Acetaminophen °Do not take more than  3,000 mg per day    ° ° °Day 7   ° °Time  °Name of Medication Number of pills taken  °Amount of Acetaminophen  °Pain Level  ° °Comments  °AM PM       °AM PM       °AM PM       °AM PM       °AM PM       °AM PM       °AM PM       °AM PM       °Total Daily amount of Acetaminophen °Do not take more than  3,000 mg per day    ° ° ° ° °For additional information about how and where to safely dispose of unused opioid °medications - https://www.morepowerfulnc.org ° °Disclaimer: This document  contains information and/or instructional materials adapted from [facility] for the typical patient with your condition. It does not replace medical advice from your health care provider because your experience may differ from that of the °typical patient. Talk to your health care provider if you have any questions about this °document, your condition or your treatment plan. °Adapted from [facility] ° °

## 2022-01-26 NOTE — Anesthesia Postprocedure Evaluation (Signed)
Anesthesia Post Note  Patient: Kelsey Dodson  Procedure(s) Performed: LAPAROSCOPIC CHOLECYSTECTOMY (Abdomen)     Patient location during evaluation: PACU Anesthesia Type: General Level of consciousness: awake and alert Pain management: pain level controlled Vital Signs Assessment: post-procedure vital signs reviewed and stable Respiratory status: spontaneous breathing, nonlabored ventilation, respiratory function stable and patient connected to nasal cannula oxygen Cardiovascular status: blood pressure returned to baseline and stable Postop Assessment: no apparent nausea or vomiting Anesthetic complications: no   No notable events documented.  Last Vitals:  Vitals:   01/26/22 0920 01/26/22 0930  BP: 135/85 (!) 141/85  Pulse: 88 82  Resp: 20 17  Temp: 36.6 C   SpO2: 96% 97%    Last Pain:  Vitals:   01/26/22 0930  TempSrc:   PainSc: 0-No pain                 Quetzally Callas S

## 2022-01-26 NOTE — Anesthesia Procedure Notes (Signed)
Procedure Name: Intubation Date/Time: 01/26/2022 8:15 AM  Performed by: Inda Coke, CRNAPre-anesthesia Checklist: Patient identified, Emergency Drugs available, Suction available, Timeout performed and Patient being monitored Patient Re-evaluated:Patient Re-evaluated prior to induction Oxygen Delivery Method: Circle system utilized Preoxygenation: Pre-oxygenation with 100% oxygen Induction Type: IV induction Ventilation: Mask ventilation without difficulty Laryngoscope Size: Mac and 3 Grade View: Grade I Tube type: Oral Number of attempts: 1 Airway Equipment and Method: Stylet Placement Confirmation: ETT inserted through vocal cords under direct vision, positive ETCO2, CO2 detector and breath sounds checked- equal and bilateral Secured at: 22 cm Tube secured with: Tape Dental Injury: Teeth and Oropharynx as per pre-operative assessment

## 2022-01-26 NOTE — Interval H&P Note (Signed)
History and Physical Interval Note:  01/26/2022 7:59 AM  Kelsey Dodson  has presented today for surgery, with the diagnosis of acute cholecystitis.  The various methods of treatment have been discussed with the patient and family. After consideration of risks, benefits and other options for treatment, the patient has consented to  Procedure(s): LAPAROSCOPIC CHOLECYSTECTOMY (N/A) as a surgical intervention.  The patient's history has been reviewed, patient examined, no change in status, stable for surgery.  I have reviewed the patient's chart and labs.  Questions were answered to the patient's satisfaction.     Axel Filler

## 2022-01-26 NOTE — Op Note (Signed)
01/26/2022  9:03 AM  PATIENT:  Kelsey Dodson  41 y.o. female  PRE-OPERATIVE DIAGNOSIS:  acute cholecystitis  POST-OPERATIVE DIAGNOSIS:  acute cholecystitis  PROCEDURE:  Procedure(s): LAPAROSCOPIC CHOLECYSTECTOMY (N/A)  SURGEON:  Surgeon(s) and Role:    * Axel Filler, MD - Primary  ASSISTANTS: Patrici Ranks, RNFA   ANESTHESIA:   local and general  EBL:  20 mL   BLOOD ADMINISTERED:none  DRAINS: none   LOCAL MEDICATIONS USED:  BUPIVICAINE   SPECIMEN:  Source of Specimen:  gallbladder  DISPOSITION OF SPECIMEN:  N/A  COUNTS:  YES  TOURNIQUET:  * No tourniquets in log *  DICTATION: .Dragon Dictation The patient was taken to the operating and placed in the supine position with bilateral SCDs in place.  The patient was prepped and draped in the usual sterile fashion. A time out was called and all facts were verified. A pneumoperitoneum was obtained via A Veress needle technique to a pressure of 14mm of mercury.  A 23mm trochar was then placed in the right upper quadrant under visualization, and there were no injuries to any abdominal organs. A 11 mm port was then placed in the umbilical region after infiltrating with local anesthesia under direct visualization. A second and third epigastric port and right lower quadrant port placement under direct visualization, respectively.    The gallbladder was identified and retracted, the peritoneum was then sharply dissected from the gallbladder and this dissection was carried down to Calot's triangle. The cystic duct was identified and stripped away circumferentially and seen going into the gallbladder 360, the critical angle was obtained.  2 clips were placed proximally one distally and the cystic duct transected. The cystic artery was identified and 2 clips placed proximally and one distally and transected.  We then proceeded to remove the gallbladder off the hepatic fossa with Bovie cautery. A retrieval bag was then placed in the  abdomen and gallbladder placed in the bag. The hepatic fossa was then reexamined and hemostasis was achieved with Bovie cautery and was excellent at the end of the case.   The subhepatic fossa and perihepatic fossa was then irrigated until the effluent was clear.  The gallbladder and bag were removed from the abdominal cavity. The 11 mm trocar fascia was reapproximated with the Endo Close #1 Vicryl x3.  The pneumoperitoneum was evacuated and all trochars removed under direct visulalization.  The skin was then closed with 4-0 Monocryl and the skin dressed with Dermabond.    The patient was awaken from general anesthesia and taken to the recovery room in stable condition.    PLAN OF CARE: to floor  PATIENT DISPOSITION:  PACU - hemodynamically stable.   Delay start of Pharmacological VTE agent (>24hrs) due to surgical blood loss or risk of bleeding: not applicable

## 2022-01-27 ENCOUNTER — Encounter (HOSPITAL_COMMUNITY): Payer: Self-pay | Admitting: General Surgery

## 2022-01-27 LAB — SURGICAL PATHOLOGY

## 2022-01-28 NOTE — Discharge Summary (Cosign Needed Addendum)
[facility] Discharge Summary   Patient ID: Kelsey Dodson MRN: [id number] DOB/AGE: [birth year] 39 y.o.  Admit date: 01/25/2022 Discharge date: 01/26/2022  Admitting Diagnosis: Acute cholecystitis [K81.0] Cholecystitis [K81.9] RUQ pain [R10.11]   Discharge Diagnosis S/p laparoscopic cholecystectomy  Consultants None  Imaging: No results found.  Procedures Dr. Derrell Lolling (01/26/22) - Laparoscopic Cholecystectomy  Hospital Course:  41 y.o. female who presented to [facility] ED with right upper quadrant abdominal pain.  Workup showed cholecystitis.  Patient was transferred to [facility] and admitted for the procedure listed above.  Tolerated procedure well and was transferred to the floor.  Diet was advanced as tolerated.  On the date of her surgery, the patient was voiding well, tolerating diet, ambulating well, pain well controlled, vital signs stable, incisions c/d/i and felt stable for discharge home.  Patient will follow up in our office in 3 weeks and knows to call with questions or concerns.     Physical Exam: General:  Alert, NAD, pleasant, comfortable Abd:  Soft, ND, mild tenderness, incisions C/D/I  I or a member of my team have reviewed this patient in the Controlled Substance Database.  Allergies as of 01/26/2022       Reactions   Diclofenac Shortness Of Breath, Other (See Comments)   Respiratory distress, chest pain    Levofloxacin Hives   Macrobid [nitrofurantoin] Hives        Medication List     TAKE these medications    acetaminophen 500 MG tablet Commonly known as: TYLENOL Take 2 tablets (1,000 mg total) by mouth every 6 (six) hours as needed.   amitriptyline 10 MG tablet Commonly known as: ELAVIL Take 20-30 mg by mouth at bedtime.   cholecalciferol 25 MCG (1000 UNIT) tablet Commonly known as: VITAMIN D3 Take 1,000 Units by mouth daily.   fluticasone 50 MCG/ACT nasal spray Commonly known as: FLONASE Place 1 spray into both  nostrils as needed for allergies or rhinitis.   ibuprofen 200 MG tablet Commonly known as: ADVIL Take 400 mg by mouth as needed for moderate pain.   Magnesium 500 MG Tabs Take 500 mg by mouth at bedtime.   Mirena (52 MG) 20 MCG/DAY Iud Generic drug: levonorgestrel 1 each by Intrauterine route once.   multivitamin with minerals tablet Take 1 tablet by mouth daily.   oxyCODONE 5 MG immediate release tablet Commonly known as: Oxy IR/ROXICODONE Take 1 tablet (5 mg total) by mouth every 6 (six) hours as needed for up to 5 days for moderate pain or severe pain.   phentermine 37.5 MG tablet Commonly known as: ADIPEX-P Take 37.5 mg by mouth daily.   Synthroid 88 MCG tablet Generic drug: levothyroxine Take 88 mcg by mouth daily before breakfast.          Follow-up Information     Maczis, Hedda Slade, PA-C Follow up on 02/17/2022.   Specialty: General Surgery Why: 9 am, Arrive 30 minutes prior to your appointment time, Please bring your insurance card and photo ID Contact information: [address] [place] [address] [facility] [place] [address] [phone]                 Signed: Eric Form , [facility] 01/28/2022, 2:46 PM Please see Amion for pager number during day hours 7:00am-4:30pm

## 2022-03-06 DIAGNOSIS — Z79899 Other long term (current) drug therapy: Secondary | ICD-10-CM | POA: Diagnosis not present

## 2022-03-06 DIAGNOSIS — R7309 Other abnormal glucose: Secondary | ICD-10-CM | POA: Diagnosis not present

## 2022-03-06 DIAGNOSIS — E0590 Thyrotoxicosis, unspecified without thyrotoxic crisis or storm: Secondary | ICD-10-CM | POA: Diagnosis not present

## 2022-03-06 DIAGNOSIS — Z13220 Encounter for screening for lipoid disorders: Secondary | ICD-10-CM | POA: Diagnosis not present

## 2022-03-13 DIAGNOSIS — Z923 Personal history of irradiation: Secondary | ICD-10-CM | POA: Diagnosis not present

## 2022-03-13 DIAGNOSIS — Z0000 Encounter for general adult medical examination without abnormal findings: Secondary | ICD-10-CM | POA: Diagnosis not present

## 2022-03-13 DIAGNOSIS — G4700 Insomnia, unspecified: Secondary | ICD-10-CM | POA: Diagnosis not present

## 2022-12-02 DIAGNOSIS — Z01419 Encounter for gynecological examination (general) (routine) without abnormal findings: Secondary | ICD-10-CM | POA: Diagnosis not present

## 2022-12-02 DIAGNOSIS — Z1231 Encounter for screening mammogram for malignant neoplasm of breast: Secondary | ICD-10-CM | POA: Diagnosis not present

## 2022-12-02 DIAGNOSIS — Z124 Encounter for screening for malignant neoplasm of cervix: Secondary | ICD-10-CM | POA: Diagnosis not present

## 2022-12-02 DIAGNOSIS — Z1151 Encounter for screening for human papillomavirus (HPV): Secondary | ICD-10-CM | POA: Diagnosis not present

## 2022-12-02 DIAGNOSIS — Z6835 Body mass index (BMI) 35.0-35.9, adult: Secondary | ICD-10-CM | POA: Diagnosis not present

## 2023-03-12 DIAGNOSIS — Z79899 Other long term (current) drug therapy: Secondary | ICD-10-CM | POA: Diagnosis not present

## 2023-03-12 DIAGNOSIS — E559 Vitamin D deficiency, unspecified: Secondary | ICD-10-CM | POA: Diagnosis not present

## 2023-03-12 DIAGNOSIS — R7309 Other abnormal glucose: Secondary | ICD-10-CM | POA: Diagnosis not present

## 2023-03-12 DIAGNOSIS — Z13220 Encounter for screening for lipoid disorders: Secondary | ICD-10-CM | POA: Diagnosis not present

## 2023-03-19 DIAGNOSIS — Z0000 Encounter for general adult medical examination without abnormal findings: Secondary | ICD-10-CM | POA: Diagnosis not present

## 2023-03-19 DIAGNOSIS — Z23 Encounter for immunization: Secondary | ICD-10-CM | POA: Diagnosis not present

## 2023-12-06 DIAGNOSIS — Z23 Encounter for immunization: Secondary | ICD-10-CM | POA: Diagnosis not present

## 2023-12-06 DIAGNOSIS — Z1151 Encounter for screening for human papillomavirus (HPV): Secondary | ICD-10-CM | POA: Diagnosis not present

## 2023-12-06 DIAGNOSIS — Z01419 Encounter for gynecological examination (general) (routine) without abnormal findings: Secondary | ICD-10-CM | POA: Diagnosis not present

## 2023-12-06 DIAGNOSIS — Z6833 Body mass index (BMI) 33.0-33.9, adult: Secondary | ICD-10-CM | POA: Diagnosis not present

## 2023-12-06 DIAGNOSIS — Z124 Encounter for screening for malignant neoplasm of cervix: Secondary | ICD-10-CM | POA: Diagnosis not present

## 2023-12-06 DIAGNOSIS — Z1231 Encounter for screening mammogram for malignant neoplasm of breast: Secondary | ICD-10-CM | POA: Diagnosis not present

## 2023-12-31 DIAGNOSIS — M9902 Segmental and somatic dysfunction of thoracic region: Secondary | ICD-10-CM | POA: Diagnosis not present

## 2023-12-31 DIAGNOSIS — M9905 Segmental and somatic dysfunction of pelvic region: Secondary | ICD-10-CM | POA: Diagnosis not present

## 2023-12-31 DIAGNOSIS — M9906 Segmental and somatic dysfunction of lower extremity: Secondary | ICD-10-CM | POA: Diagnosis not present

## 2023-12-31 DIAGNOSIS — M9903 Segmental and somatic dysfunction of lumbar region: Secondary | ICD-10-CM | POA: Diagnosis not present

## 2024-01-03 DIAGNOSIS — M9903 Segmental and somatic dysfunction of lumbar region: Secondary | ICD-10-CM | POA: Diagnosis not present

## 2024-01-03 DIAGNOSIS — M9902 Segmental and somatic dysfunction of thoracic region: Secondary | ICD-10-CM | POA: Diagnosis not present

## 2024-01-03 DIAGNOSIS — M9905 Segmental and somatic dysfunction of pelvic region: Secondary | ICD-10-CM | POA: Diagnosis not present

## 2024-01-03 DIAGNOSIS — M9906 Segmental and somatic dysfunction of lower extremity: Secondary | ICD-10-CM | POA: Diagnosis not present

## 2024-01-05 DIAGNOSIS — M9903 Segmental and somatic dysfunction of lumbar region: Secondary | ICD-10-CM | POA: Diagnosis not present

## 2024-01-05 DIAGNOSIS — M9905 Segmental and somatic dysfunction of pelvic region: Secondary | ICD-10-CM | POA: Diagnosis not present

## 2024-01-05 DIAGNOSIS — M9906 Segmental and somatic dysfunction of lower extremity: Secondary | ICD-10-CM | POA: Diagnosis not present

## 2024-01-05 DIAGNOSIS — M9902 Segmental and somatic dysfunction of thoracic region: Secondary | ICD-10-CM | POA: Diagnosis not present

## 2024-01-10 DIAGNOSIS — M9906 Segmental and somatic dysfunction of lower extremity: Secondary | ICD-10-CM | POA: Diagnosis not present

## 2024-01-10 DIAGNOSIS — M9902 Segmental and somatic dysfunction of thoracic region: Secondary | ICD-10-CM | POA: Diagnosis not present

## 2024-01-10 DIAGNOSIS — M9903 Segmental and somatic dysfunction of lumbar region: Secondary | ICD-10-CM | POA: Diagnosis not present

## 2024-01-10 DIAGNOSIS — M9905 Segmental and somatic dysfunction of pelvic region: Secondary | ICD-10-CM | POA: Diagnosis not present

## 2024-01-12 DIAGNOSIS — M9906 Segmental and somatic dysfunction of lower extremity: Secondary | ICD-10-CM | POA: Diagnosis not present

## 2024-01-12 DIAGNOSIS — M9905 Segmental and somatic dysfunction of pelvic region: Secondary | ICD-10-CM | POA: Diagnosis not present

## 2024-01-12 DIAGNOSIS — M9903 Segmental and somatic dysfunction of lumbar region: Secondary | ICD-10-CM | POA: Diagnosis not present

## 2024-01-12 DIAGNOSIS — M9902 Segmental and somatic dysfunction of thoracic region: Secondary | ICD-10-CM | POA: Diagnosis not present

## 2024-01-19 DIAGNOSIS — M9906 Segmental and somatic dysfunction of lower extremity: Secondary | ICD-10-CM | POA: Diagnosis not present

## 2024-01-19 DIAGNOSIS — M9902 Segmental and somatic dysfunction of thoracic region: Secondary | ICD-10-CM | POA: Diagnosis not present

## 2024-01-19 DIAGNOSIS — M9903 Segmental and somatic dysfunction of lumbar region: Secondary | ICD-10-CM | POA: Diagnosis not present

## 2024-01-19 DIAGNOSIS — M9905 Segmental and somatic dysfunction of pelvic region: Secondary | ICD-10-CM | POA: Diagnosis not present

## 2024-01-26 DIAGNOSIS — M9905 Segmental and somatic dysfunction of pelvic region: Secondary | ICD-10-CM | POA: Diagnosis not present

## 2024-01-26 DIAGNOSIS — M9906 Segmental and somatic dysfunction of lower extremity: Secondary | ICD-10-CM | POA: Diagnosis not present

## 2024-01-26 DIAGNOSIS — M9902 Segmental and somatic dysfunction of thoracic region: Secondary | ICD-10-CM | POA: Diagnosis not present

## 2024-01-26 DIAGNOSIS — M9903 Segmental and somatic dysfunction of lumbar region: Secondary | ICD-10-CM | POA: Diagnosis not present

## 2024-02-02 DIAGNOSIS — M9905 Segmental and somatic dysfunction of pelvic region: Secondary | ICD-10-CM | POA: Diagnosis not present

## 2024-02-02 DIAGNOSIS — M9906 Segmental and somatic dysfunction of lower extremity: Secondary | ICD-10-CM | POA: Diagnosis not present

## 2024-02-02 DIAGNOSIS — M9903 Segmental and somatic dysfunction of lumbar region: Secondary | ICD-10-CM | POA: Diagnosis not present

## 2024-02-02 DIAGNOSIS — M9902 Segmental and somatic dysfunction of thoracic region: Secondary | ICD-10-CM | POA: Diagnosis not present

## 2024-02-09 DIAGNOSIS — M9905 Segmental and somatic dysfunction of pelvic region: Secondary | ICD-10-CM | POA: Diagnosis not present

## 2024-02-09 DIAGNOSIS — M9903 Segmental and somatic dysfunction of lumbar region: Secondary | ICD-10-CM | POA: Diagnosis not present

## 2024-02-09 DIAGNOSIS — M9906 Segmental and somatic dysfunction of lower extremity: Secondary | ICD-10-CM | POA: Diagnosis not present

## 2024-02-09 DIAGNOSIS — M9902 Segmental and somatic dysfunction of thoracic region: Secondary | ICD-10-CM | POA: Diagnosis not present

## 2024-02-16 DIAGNOSIS — M9902 Segmental and somatic dysfunction of thoracic region: Secondary | ICD-10-CM | POA: Diagnosis not present

## 2024-02-16 DIAGNOSIS — M9903 Segmental and somatic dysfunction of lumbar region: Secondary | ICD-10-CM | POA: Diagnosis not present

## 2024-02-16 DIAGNOSIS — M9905 Segmental and somatic dysfunction of pelvic region: Secondary | ICD-10-CM | POA: Diagnosis not present

## 2024-02-16 DIAGNOSIS — M9906 Segmental and somatic dysfunction of lower extremity: Secondary | ICD-10-CM | POA: Diagnosis not present

## 2024-02-23 DIAGNOSIS — M9902 Segmental and somatic dysfunction of thoracic region: Secondary | ICD-10-CM | POA: Diagnosis not present

## 2024-02-23 DIAGNOSIS — M9905 Segmental and somatic dysfunction of pelvic region: Secondary | ICD-10-CM | POA: Diagnosis not present

## 2024-02-23 DIAGNOSIS — M9906 Segmental and somatic dysfunction of lower extremity: Secondary | ICD-10-CM | POA: Diagnosis not present

## 2024-02-23 DIAGNOSIS — M9903 Segmental and somatic dysfunction of lumbar region: Secondary | ICD-10-CM | POA: Diagnosis not present
